# Patient Record
Sex: Male | Born: 1995 | Race: White | Hispanic: No | Marital: Single | Smoking: Former smoker
Health system: Southern US, Community
[De-identification: ages and names within clinical notes are randomized; demographics above are authoritative.]

## PROBLEM LIST (undated history)

## (undated) DIAGNOSIS — L509 Urticaria, unspecified: Secondary | ICD-10-CM

## (undated) DIAGNOSIS — T783XXA Angioneurotic edema, initial encounter: Secondary | ICD-10-CM

## (undated) DIAGNOSIS — R011 Cardiac murmur, unspecified: Secondary | ICD-10-CM

## (undated) DIAGNOSIS — F410 Panic disorder [episodic paroxysmal anxiety] without agoraphobia: Secondary | ICD-10-CM

## (undated) DIAGNOSIS — R569 Unspecified convulsions: Secondary | ICD-10-CM

## (undated) DIAGNOSIS — F419 Anxiety disorder, unspecified: Secondary | ICD-10-CM

## (undated) HISTORY — DX: Angioneurotic edema, initial encounter: T78.3XXA

## (undated) HISTORY — DX: Urticaria, unspecified: L50.9

---
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
## 2019-09-05 ENCOUNTER — Encounter: Payer: Self-pay | Admitting: Emergency Medicine

## 2019-09-05 ENCOUNTER — Emergency Department: Payer: Self-pay

## 2019-09-05 ENCOUNTER — Other Ambulatory Visit: Payer: Self-pay

## 2019-09-05 ENCOUNTER — Emergency Department
Admission: EM | Admit: 2019-09-05 | Discharge: 2019-09-05 | Disposition: A | Discharge status: Home / Self Care | Payer: Self-pay | Attending: Emergency Medicine | Admitting: Emergency Medicine

## 2019-09-05 DIAGNOSIS — R55 Syncope and collapse: Secondary | ICD-10-CM

## 2019-09-05 DIAGNOSIS — Z87891 Personal history of nicotine dependence: Secondary | ICD-10-CM | POA: Insufficient documentation

## 2019-09-05 DIAGNOSIS — R002 Palpitations: Secondary | ICD-10-CM | POA: Insufficient documentation

## 2019-09-05 DIAGNOSIS — R0602 Shortness of breath: Secondary | ICD-10-CM | POA: Insufficient documentation

## 2019-09-05 DIAGNOSIS — R42 Dizziness and giddiness: Secondary | ICD-10-CM | POA: Insufficient documentation

## 2019-09-05 HISTORY — DX: Cardiac murmur, unspecified: R01.1

## 2019-09-05 LAB — CBC
HCT: 45.4 % (ref 39.0–52.0)
Hemoglobin: 15.7 g/dL (ref 13.0–17.0)
MCH: 29.9 pg (ref 26.0–34.0)
MCHC: 34.6 g/dL (ref 30.0–36.0)
MCV: 86.5 fL (ref 80.0–100.0)
Platelets: 198 10*3/uL (ref 150–400)
RBC: 5.25 MIL/uL (ref 4.22–5.81)
RDW: 12.2 % (ref 11.5–15.5)
WBC: 6 10*3/uL (ref 4.0–10.5)
nRBC: 0 % (ref 0.0–0.2)

## 2019-09-05 LAB — BASIC METABOLIC PANEL
Anion gap: 9 (ref 5–15)
BUN: 11 mg/dL (ref 6–20)
CO2: 26 mmol/L (ref 22–32)
Calcium: 9.6 mg/dL (ref 8.9–10.3)
Chloride: 102 mmol/L (ref 98–111)
Creatinine, Ser: 0.79 mg/dL (ref 0.61–1.24)
GFR calc Af Amer: >60 mL/min (ref >60)
GFR calc non Af Amer: >60 mL/min (ref >60)
Glucose, Bld: 93 mg/dL (ref 70–99)
Potassium: 3.6 mmol/L (ref 3.5–5.1)
Sodium: 137 mmol/L (ref 135–145)

## 2019-09-05 LAB — URINALYSIS, COMPLETE (UACMP) WITH MICROSCOPIC
Bacteria, UA: NONE SEEN
Bilirubin Urine: NEGATIVE
Glucose, UA: NEGATIVE mg/dL
Hgb urine dipstick: NEGATIVE
Ketones, ur: NEGATIVE mg/dL
Leukocytes,Ua: NEGATIVE
Nitrite: NEGATIVE
Protein, ur: NEGATIVE mg/dL
Specific Gravity, Urine: 1.002 — ABNORMAL LOW (ref 1.005–1.030)
WBC, UA: NONE SEEN WBC/hpf (ref 0–5)
pH: 7 (ref 5.0–8.0)

## 2019-09-05 LAB — GLUCOSE, CAPILLARY: Glucose-Capillary: 93 mg/dL (ref 70–99)

## 2019-09-05 NOTE — Discharge Instructions (Signed)
Your workup here was re-assuring. Your EKG

## 2019-09-05 NOTE — ED Notes (Signed)

## 2019-09-05 NOTE — ED Provider Notes (Signed)
[facility] Emergency Department Provider Note  ____________________________________________   First MD Initiated Contact with Patient 09/05/19 1200     (approximate)  I have reviewed the triage vital signs and the nursing notes.   HISTORY  Chief Complaint Dizziness    HPI Edwin Maldonado is a 23 y.o. male with an episode of feeling dizzy and feeling hot with some shortness of breath.  Patient said he was at work when he he started feeling hot all over.  He has started to feel lightheaded like he was going to pass out but he did not pass out.  He does feel like his heart was racing.  No chest pain.  He did have some mild shortness of breath associated with it.  The symptoms went away.  He never lost consciousness.  He was worried that this could be related to his heart because he was told he had a murmur as a child.  He went to a urgent care sometime back and was told to follow with cardiology but he never did.  Patient says he did start using a different kind of marijuana recently.  He did smoke marijuana before this happened.  He denies any leg swelling, recent travel, recent surgeries. Denies IV drug use.   Dizziness onset this morning around 930, intermittent, better on its own, nothing made it come on.           Past Medical History:  Diagnosis Date  . Heart murmur     There are no active problems to display for this patient.   History reviewed. No pertinent surgical history.  Prior to Admission medications   Not on File    Allergies Patient has no known allergies.  No family history on file.  Social History Social History   Tobacco Use  . Smoking status: Former Smoker    Types: Cigarettes  . Smokeless tobacco: Never Used  Substance Use Topics  . Alcohol use: Yes    Comment: occasionally  . Drug use: Yes    Types: Marijuana      Review of Systems Constitutional: No fever/chills, lightheadedness Eyes: No visual changes.  ENT: No sore throat. Cardiovascular: Denies chest pain.  Positive feeling his heart was racing Respiratory: Positive shortness of breath Gastrointestinal: No abdominal pain.  No nausea, no vomiting.  No diarrhea.  No constipation. Genitourinary: Negative for dysuria. Musculoskeletal: Negative for back pain. Skin: Negative for rash. Neurological: Negative for headaches, focal weakness or numbness. All other ROS negative ____________________________________________   PHYSICAL EXAM:  VITAL SIGNS: ED Triage Vitals  Enc Vitals Group     BP 09/05/19 1036 133/84     Pulse Rate 09/05/19 1036 79     Resp 09/05/19 1036 16     Temp 09/05/19 1036 98.9 F (37.2 C)     Temp Source 09/05/19 1036 Oral     SpO2 09/05/19 1036 97 %     Weight 09/05/19 1038 185 lb (83.9 kg)     Height 09/05/19 1038 6\' 3"  (1.905 m)     Head Circumference --      Peak Flow --      Pain Score 09/05/19 1038 0     Pain Loc --      Pain Edu? --      Excl. in GC? --     Constitutional: Alert and oriented. Well appearing and in no acute distress. Eyes: Conjunctivae are normal. EOMI. Head: Atraumatic. Nose: No congestion/rhinnorhea. Mouth/Throat: Mucous membranes are moist.  Neck: No stridor. Trachea Midline. FROM Cardiovascular: Normal rate, regular rhythm. Grossly normal heart sounds.  Good peripheral circulation. Respiratory: Normal respiratory effort.  No retractions. Lungs CTAB. Gastrointestinal: Soft and nontender. No distention. No abdominal bruits.  Musculoskeletal: No lower extremity tenderness nor edema.  No joint effusions. Neurologic:  Normal speech and language. No gross focal neurologic deficits are appreciated.  Skin:  Skin is warm, dry and intact. No rash noted. Psychiatric: Mood and affect are normal. Speech and behavior are normal. GU: Deferred   ____________________________________________   LABS (all labs ordered are listed, but only abnormal results are displayed)  Labs Reviewed   URINALYSIS, COMPLETE (UACMP) WITH MICROSCOPIC - Abnormal; Notable for the following components:      Result Value   Color, Urine COLORLESS (*)    APPearance CLEAR (*)    Specific Gravity, Urine 1.002 (*)    All other components within normal limits  BASIC METABOLIC PANEL  CBC  GLUCOSE, CAPILLARY  CBG MONITORING, ED   ____________________________________________   ED ECG REPORT I, Vanessa , the attending physician, personally viewed and interpreted this ECG.  EKG is normal sinus rate of 86, no ST elevation, no T wave inversion, right bundle branch block, deep S waves. ____________________________________________    PROCEDURES  Procedure(s) performed (including Critical Care):  Procedures   ____________________________________________   INITIAL IMPRESSION / ASSESSMENT AND PLAN / ED COURSE  Edwin Maldonado was evaluated in Emergency Department on 09/05/2019 for the symptoms described in the history of present illness. He was evaluated in the context of the global COVID-19 pandemic, which necessitated consideration that the patient might be at risk for infection with the SARS-CoV-2 virus that causes COVID-19. Institutional protocols and algorithms that pertain to the evaluation of patients at risk for COVID-19 are in a state of rapid change based on information released by regulatory bodies including the CDC and federal and state organizations. These policies and algorithms were followed during the patient's care in the ED.    Patient presents with a near syncope episode.  Is most likely vasovagal in nature.  However given patient's history of murmur consider arrhythmia will get EKG, electrolytes, anemia, kidney dysfunction.  No evidence of heart failure on examination.  PERC negative so low suspicion for PE.  Although patient does have a history of murmur, I did not really appreciate one on examination. No evidence of endocarditis.   Labs are reassuring with normal kidney  function and normal electrolytes.  White count is normal therefore no evidence of infection.  Hemoglobin is normal therefore no evidence of anemia.  UA without evidence of urinary infection or dehydration.  Glucose was normal.  EKG without evidence of HOCM, WPW, Brugada.   Pt declined xray.   I discussed with patient that given his history of murmur and feeling palpitations that he should follow-up with cardiology to discuss needing a Holter monitor versus echocardiogram.  Patient expressed understanding.  Will follow up with cardiology outpatient.  Patient feels comfortable with discharge home.  I discussed the provisional nature of ED diagnosis, the treatment so far, the ongoing plan of care, follow up appointments and return precautions with the patient and any family or support people present. They expressed understanding and agreed with the plan, discharged home.     FINAL CLINICAL IMPRESSION(S) / ED DIAGNOSES   Final diagnoses:  Near syncope  Palpitations      MEDICATIONS GIVEN DURING THIS VISIT:  Medications - No data to display   ED Discharge Orders  None       Note:  This document was prepared using Dragon voice recognition software and may include unintentional dictation errors.   Concha Se, MD 09/05/19 1255

## 2019-09-05 NOTE — ED Triage Notes (Signed)
Patient presents to the ED with an episode of dizziness/feeling very hot and somewhat short of breath at work.  Patient states he is feeling much better but not feeling completely back to normal.  Patient reports a known heart murmur.  Patient is in no obvious distress at this time.

## 2020-01-09 DIAGNOSIS — K219 Gastro-esophageal reflux disease without esophagitis: Secondary | ICD-10-CM | POA: Insufficient documentation

## 2021-03-14 ENCOUNTER — Other Ambulatory Visit: Payer: Self-pay

## 2021-03-14 ENCOUNTER — Emergency Department (HOSPITAL_BASED_OUTPATIENT_CLINIC_OR_DEPARTMENT_OTHER)
Admission: EM | Admit: 2021-03-14 | Discharge: 2021-03-14 | Disposition: A | Discharge status: Home / Self Care | Payer: No Typology Code available for payment source | Attending: Emergency Medicine | Admitting: Emergency Medicine

## 2021-03-14 ENCOUNTER — Emergency Department (HOSPITAL_BASED_OUTPATIENT_CLINIC_OR_DEPARTMENT_OTHER): Payer: No Typology Code available for payment source | Admitting: Radiology

## 2021-03-14 ENCOUNTER — Encounter (HOSPITAL_BASED_OUTPATIENT_CLINIC_OR_DEPARTMENT_OTHER): Payer: Self-pay | Admitting: *Deleted

## 2021-03-14 DIAGNOSIS — M25552 Pain in left hip: Secondary | ICD-10-CM | POA: Diagnosis not present

## 2021-03-14 DIAGNOSIS — Z87891 Personal history of nicotine dependence: Secondary | ICD-10-CM | POA: Insufficient documentation

## 2021-03-14 DIAGNOSIS — M545 Low back pain, unspecified: Secondary | ICD-10-CM | POA: Insufficient documentation

## 2021-03-14 DIAGNOSIS — Y92410 Unspecified street and highway as the place of occurrence of the external cause: Secondary | ICD-10-CM | POA: Insufficient documentation

## 2021-03-14 MED ORDER — IBUPROFEN 800 MG PO TABS: 800.0000 mg | ORAL_TABLET | Freq: Three times a day (TID) | ORAL | 0 refills | Status: DC | PRN

## 2021-03-14 MED ORDER — IBUPROFEN 800 MG PO TABS
800.0000 mg | ORAL_TABLET | Freq: Once | ORAL | Status: AC
  Administered 2021-03-14: 800 mg via ORAL
  Filled 2021-03-14: qty 1

## 2021-03-14 MED ORDER — METHOCARBAMOL 500 MG PO TABS: 500.0000 mg | ORAL_TABLET | Freq: Four times a day (QID) | ORAL | 0 refills | Status: DC | PRN

## 2021-03-14 MED ORDER — METHOCARBAMOL 500 MG PO TABS
500.0000 mg | ORAL_TABLET | Freq: Once | ORAL | Status: AC
  Administered 2021-03-14: 500 mg via ORAL
  Filled 2021-03-14: qty 1

## 2021-03-14 NOTE — ED Notes (Signed)
Patient transported to XRAY at this Time. 

## 2021-03-14 NOTE — Discharge Instructions (Signed)
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
## 2021-10-13 ENCOUNTER — Emergency Department (HOSPITAL_BASED_OUTPATIENT_CLINIC_OR_DEPARTMENT_OTHER)
Admission: EM | Admit: 2021-10-13 | Discharge: 2021-10-14 | Disposition: A | Discharge status: Home / Self Care | Payer: Self-pay | Attending: Emergency Medicine | Admitting: Emergency Medicine

## 2021-10-13 ENCOUNTER — Other Ambulatory Visit: Payer: Self-pay

## 2021-10-13 ENCOUNTER — Encounter (HOSPITAL_BASED_OUTPATIENT_CLINIC_OR_DEPARTMENT_OTHER): Payer: Self-pay | Admitting: Obstetrics and Gynecology

## 2021-10-13 DIAGNOSIS — Z20822 Contact with and (suspected) exposure to covid-19: Secondary | ICD-10-CM | POA: Insufficient documentation

## 2021-10-13 DIAGNOSIS — Z87891 Personal history of nicotine dependence: Secondary | ICD-10-CM | POA: Insufficient documentation

## 2021-10-13 DIAGNOSIS — R21 Rash and other nonspecific skin eruption: Secondary | ICD-10-CM | POA: Insufficient documentation

## 2021-10-13 DIAGNOSIS — B349 Viral infection, unspecified: Secondary | ICD-10-CM | POA: Insufficient documentation

## 2021-10-13 LAB — RESP PANEL BY RT-PCR (FLU A&B, COVID) ARPGX2
Influenza A by PCR: NEGATIVE
Influenza B by PCR: NEGATIVE
SARS Coronavirus 2 by RT PCR: NEGATIVE

## 2021-10-13 NOTE — ED Triage Notes (Signed)
Patient reports to the ER for fever. Patient reports his fever keeps coming and going back up. Patient states he has been taking Tylenol Cold and Flu

## 2021-10-14 MED ORDER — IBUPROFEN 400 MG PO TABS
600.0000 mg | ORAL_TABLET | Freq: Once | ORAL | Status: AC
  Administered 2021-10-14: 600 mg via ORAL
  Filled 2021-10-14: qty 1

## 2021-10-14 NOTE — ED Notes (Signed)
Pt verbalizes understanding of discharge instructions. Opportunity for questioning and answers were provided. Armand removed by staff, pt discharged from ED to home. Educated on how to manage fever and take tylenol as needed.

## 2021-10-14 NOTE — ED Provider Notes (Signed)
[facility] EMERGENCY DEPT Provider Note   CSN: [id number] Arrival date & time: 10/13/21  2105     History Chief Complaint  Patient presents with   Fever    Edwin Maldonado is a 25 y.o. male.  HPI     This is a 25 year old male who presents with fever.  Patient reports 1 day history of fever up to 101.  He states he has taken Tylenol Cold and flu with some relief of symptoms.  He is also noted a rash over his face that is itchy.  He has not noticed a rash anywhere else.  No new exposures including foods, medications, detergents, lotions.  He has seen several people at work who have been sick.  He denies any sore throat, cough, shortness of breath, chest pain, nausea, vomiting.  He does report myalgias.  Past Medical History:  Diagnosis Date   Heart murmur     There are no problems to display for this patient.   History reviewed. No pertinent surgical history.     No family history on file.  Social History   Tobacco Use   Smoking status: Former    Types: Cigarettes   Smokeless tobacco: Never  Vaping Use   Vaping Use: Every day   Substances: Nicotine, Flavoring  Substance Use Topics   Alcohol use: Yes    Comment: occasionally   Drug use: Yes    Types: Marijuana    Home Medications Prior to Admission medications   Medication Sig Start Date End Date Taking? Authorizing Provider  ibuprofen (ADVIL) 800 MG tablet Take 1 tablet (800 mg total) by mouth every 8 (eight) hours as needed for moderate pain. 03/14/21   Long, Arlyss Repress, MD  methocarbamol (ROBAXIN) 500 MG tablet Take 1 tablet (500 mg total) by mouth every 6 (six) hours as needed for muscle spasms. 03/14/21   Long, Arlyss Repress, MD    Allergies    Patient has no known allergies.  Review of Systems   Review of Systems  Constitutional:  Positive for chills and fever.  Respiratory:  Negative for cough and shortness of breath.   Cardiovascular:  Negative for chest pain.  Gastrointestinal:   Negative for nausea and vomiting.  Skin:  Positive for rash.  Neurological:  Negative for headaches.  All other systems reviewed and are negative.  Physical Exam Updated Vital Signs BP (!) 145/75 (BP Location: Left Arm)   Pulse 100   Temp 99.8 F (37.7 C)   Resp 20   SpO2 97%   Physical Exam Vitals and nursing note reviewed.  Constitutional:      Appearance: He is well-developed. He is not ill-appearing.  HENT:     Head: Normocephalic and atraumatic.     Nose: Nose normal. No congestion.     Mouth/Throat:     Mouth: Mucous membranes are moist.     Pharynx: No oropharyngeal exudate or posterior oropharyngeal erythema.  Eyes:     Pupils: Pupils are equal, round, and reactive to light.  Cardiovascular:     Rate and Rhythm: Normal rate and regular rhythm.     Heart sounds: Normal heart sounds. No murmur heard. Pulmonary:     Effort: Pulmonary effort is normal. No respiratory distress.     Breath sounds: Normal breath sounds. No wheezing.  Abdominal:     Palpations: Abdomen is soft.     Tenderness: There is no abdominal tenderness. There is no rebound.  Musculoskeletal:     Cervical back: Normal  range of motion and neck supple.  Lymphadenopathy:     Cervical: No cervical adenopathy.  Skin:    General: Skin is warm and dry.     Comments: Blotchy urticarial type rash over the face and neck, blanching, confluent, no additional rash noted over the trunk, extremities, palms or soles  Neurological:     Mental Status: He is alert and oriented to person, place, and time.  Psychiatric:        Mood and Affect: Mood normal.    ED Results / Procedures / Treatments   Labs (all labs ordered are listed, but only abnormal results are displayed) Labs Reviewed  RESP PANEL BY RT-PCR (FLU A&B, COVID) [id number]    EKG None  Radiology No results found.  Procedures Procedures   Medications Ordered in ED Medications - No data to display  ED Course  I have reviewed the triage vital  signs and the nursing notes.  Pertinent labs & imaging results that were available during my care of the patient were reviewed by me and considered in my medical decision making (see chart for details).    MDM Rules/Calculators/A&P                           Patient presents with a fever.  He is nontoxic-appearing and vital signs are reassuring.  Aside from the fever he endorses a rash and myalgias.  No other significant symptoms.  COVID and influenza testing are negative.  His rash is interesting in that it just seems limited to the face.  It is urticarial in nature and confluent.  No signs or symptoms of anaphylaxis.  Question viral etiology.  Discussed with patient high suspicion for viral etiology.  Do not feel any warrants further work-up at this time.  Recommend Benadryl for itching and Tylenol or Motrin for fevers.  After history, exam, and medical workup I feel the patient has been appropriately medically screened and is safe for discharge home. Pertinent diagnoses were discussed with the patient. Patient was given return precautions.  Final Clinical Impression(s) / ED Diagnoses Final diagnoses:  Acute viral syndrome    Rx / DC Orders ED Discharge Orders     None        Meliyah Simon, Mayer Masker, MD 10/14/21 [phone]

## 2021-10-16 ENCOUNTER — Emergency Department (HOSPITAL_BASED_OUTPATIENT_CLINIC_OR_DEPARTMENT_OTHER): Payer: Self-pay

## 2021-10-16 ENCOUNTER — Emergency Department (HOSPITAL_BASED_OUTPATIENT_CLINIC_OR_DEPARTMENT_OTHER)
Admission: EM | Admit: 2021-10-16 | Discharge: 2021-10-16 | Disposition: A | Discharge status: Home / Self Care | Payer: Self-pay | Attending: Emergency Medicine | Admitting: Emergency Medicine

## 2021-10-16 ENCOUNTER — Encounter (HOSPITAL_BASED_OUTPATIENT_CLINIC_OR_DEPARTMENT_OTHER): Payer: Self-pay | Admitting: *Deleted

## 2021-10-16 ENCOUNTER — Other Ambulatory Visit: Payer: Self-pay

## 2021-10-16 DIAGNOSIS — Z87891 Personal history of nicotine dependence: Secondary | ICD-10-CM | POA: Insufficient documentation

## 2021-10-16 DIAGNOSIS — R42 Dizziness and giddiness: Secondary | ICD-10-CM | POA: Insufficient documentation

## 2021-10-16 DIAGNOSIS — R0789 Other chest pain: Secondary | ICD-10-CM | POA: Insufficient documentation

## 2021-10-16 DIAGNOSIS — R079 Chest pain, unspecified: Secondary | ICD-10-CM

## 2021-10-16 LAB — CBC WITH DIFFERENTIAL/PLATELET
Abs Immature Granulocytes: 0.01 10*3/uL (ref 0.00–0.07)
Basophils Absolute: 0 10*3/uL (ref 0.0–0.1)
Basophils Relative: 0 %
Eosinophils Absolute: 0 10*3/uL (ref 0.0–0.5)
Eosinophils Relative: 0 %
HCT: 49.3 % (ref 39.0–52.0)
Hemoglobin: 17.5 g/dL — ABNORMAL HIGH (ref 13.0–17.0)
Immature Granulocytes: 0 %
Lymphocytes Relative: 32 %
Lymphs Abs: 1.4 10*3/uL (ref 0.7–4.0)
MCH: 30.1 pg (ref 26.0–34.0)
MCHC: 35.5 g/dL (ref 30.0–36.0)
MCV: 84.9 fL (ref 80.0–100.0)
Monocytes Absolute: 0.8 10*3/uL (ref 0.1–1.0)
Monocytes Relative: 18 %
Neutro Abs: 2.2 10*3/uL (ref 1.7–7.7)
Neutrophils Relative %: 50 %
Platelets: 172 10*3/uL (ref 150–400)
RBC: 5.81 MIL/uL (ref 4.22–5.81)
RDW: 12.7 % (ref 11.5–15.5)
WBC: 4.3 10*3/uL (ref 4.0–10.5)
nRBC: 0 % (ref 0.0–0.2)

## 2021-10-16 LAB — BASIC METABOLIC PANEL
Anion gap: 9 (ref 5–15)
BUN: 12 mg/dL (ref 6–20)
CO2: 27 mmol/L (ref 22–32)
Calcium: 9.4 mg/dL (ref 8.9–10.3)
Chloride: 101 mmol/L (ref 98–111)
Creatinine, Ser: 0.71 mg/dL (ref 0.61–1.24)
GFR, Estimated: >60 mL/min (ref >60)
Glucose, Bld: 76 mg/dL (ref 70–99)
Potassium: 4.1 mmol/L (ref 3.5–5.1)
Sodium: 137 mmol/L (ref 135–145)

## 2021-10-16 LAB — TROPONIN I (HIGH SENSITIVITY): Troponin I (High Sensitivity): 5 ng/L (ref <18)

## 2021-10-16 LAB — D-DIMER, QUANTITATIVE: D-Dimer, Quant: <0.27 ug/mL-FEU (ref 0.00–0.50)

## 2021-10-16 NOTE — ED Provider Notes (Signed)
[facility] EMERGENCY DEPT Provider Note   CSN: [id number] Arrival date & time: 10/16/21  1114     History Chief Complaint  Patient presents with   Anxiety    Edwin Maldonado is a 25 y.o. male.  HPI  Patient presents with tight chest tightness.  The started acutely this morning, lasted for a few seconds.  Patient felt like his heart rate was beating quickly, did not actually check his heart rate at that time.  He felt lightheaded when he stood up, felt lightheaded in the shower.  It resolved without intervention, no falls.  Denies any dizziness or room spinning.  Patient reports the chest pain returned when he was driving prompting him to come to the ED.  Has not had pain like this before, was recently seen in the emergency department and diagnosed with a viral illness.  He was negative for COVID and flu, has been taking Tylenol which is helped with the symptoms.  Denies any cough, congestion, sore throat, shortness of breath.    Past Medical History:  Diagnosis Date   Heart murmur     There are no problems to display for this patient.   History reviewed. No pertinent surgical history.     No family history on file.  Social History   Tobacco Use   Smoking status: Former    Types: Cigarettes   Smokeless tobacco: Never  Vaping Use   Vaping Use: Every day   Substances: Nicotine, Flavoring  Substance Use Topics   Alcohol use: Yes    Comment: occasionally   Drug use: Yes    Types: Marijuana    Comment: last use 2 days ago 10/04/21    Home Medications Prior to Admission medications   Medication Sig Start Date End Date Taking? Authorizing Provider  ibuprofen (ADVIL) 800 MG tablet Take 1 tablet (800 mg total) by mouth every 8 (eight) hours as needed for moderate pain. 03/14/21   Long, Arlyss Repress, MD  methocarbamol (ROBAXIN) 500 MG tablet Take 1 tablet (500 mg total) by mouth every 6 (six) hours as needed for muscle spasms. 03/14/21   Long, Arlyss Repress,  MD    Allergies    Patient has no known allergies.  Review of Systems   Review of Systems  Constitutional:  Positive for fever.  HENT:  Negative for congestion.   Respiratory:  Positive for shortness of breath.   Cardiovascular:  Positive for chest pain and palpitations.  Gastrointestinal:  Negative for abdominal pain, nausea and vomiting.  Musculoskeletal:  Negative for back pain.  Neurological:  Positive for light-headedness. Negative for syncope.   Physical Exam Updated Vital Signs BP (!) 135/91 (BP Location: Right Arm)   Pulse (!) 59   Temp 97.7 F (36.5 C) (Oral)   Resp 18   Ht 6\' 3"  (1.905 m)   Wt 95.3 kg   SpO2 100%   BMI 26.25 kg/m   Physical Exam Vitals and nursing note reviewed. Exam conducted with a chaperone present.  Constitutional:      General: He is not in acute distress.    Appearance: Normal appearance.  HENT:     Head: Normocephalic and atraumatic.  Eyes:     General: No scleral icterus.       Right eye: No discharge.        Left eye: No discharge.     Extraocular Movements: Extraocular movements intact.     Pupils: Pupils are equal, round, and reactive to light.  Cardiovascular:     Rate and Rhythm: Normal rate and regular rhythm.     Pulses: Normal pulses.     Heart sounds: Normal heart sounds. No murmur heard.   No friction rub. No gallop.     Comments: Heart rate regular, no tachycardia. Pulmonary:     Effort: Pulmonary effort is normal. No respiratory distress.     Breath sounds: Normal breath sounds.     Comments: Lungs are clear to auscultation bilaterally Abdominal:     General: Abdomen is flat. Bowel sounds are normal. There is no distension.     Palpations: Abdomen is soft.     Tenderness: There is no abdominal tenderness.  Skin:    General: Skin is warm and dry.     Coloration: Skin is not jaundiced.  Neurological:     Mental Status: He is alert. Mental status is at baseline.     Coordination: Coordination normal.    ED  Results / Procedures / Treatments   Labs (all labs ordered are listed, but only abnormal results are displayed) Labs Reviewed - No data to display  EKG None  Radiology No results found.  Procedures Procedures   Medications Ordered in ED Medications - No data to display  ED Course  I have reviewed the triage vital signs and the nursing notes.  Pertinent labs & imaging results that were available during my care of the patient were reviewed by me and considered in my medical decision making (see chart for details).    MDM Rules/Calculators/A&P                           Patient presents with chest pain and lightheadedness.  This happened acutely, and is still happening intermittently.  Otherwise suspect this is likely secondary due to orthostatic changes, patient does have signs of heart strain on the EKG.  Discussed with my attending Dr. Durwin Nora who thinks we should check a troponin and dimer to rule out possible acute etiologies for heart strain.  We will proceed with labs and will get portable chest x-ray.  Vitals are stable, patient has been observed without any signs of arrhythmia on the cardiac monitor.  Dimer negative, doubt PE.  Troponin negative, no signs of heart strain.  Radiograph negative for pneumonia or any cardiopulmonary disease.  Cytosis, patient is not anemic.  Discharged in stable position, will have him follow-up with PCP.  No concern for emergent pathology based on his work-up at this time.  Final Clinical Impression(s) / ED Diagnoses Final diagnoses:  None    Rx / DC Orders ED Discharge Orders     None        Theron Arista, Cordelia Poche 10/16/21 1444    Gloris Manchester, MD 10/17/21 2139

## 2021-10-16 NOTE — Discharge Instructions (Addendum)
Your work-up was reassuring, no signs of cardiovascular pathology.  Continue staying hydrated and eating food.  Continue getting 8 hours of sleep.  Discontinue vaping and smoking cigarettes.  Can return back to work when you are feeling better, if you are still feeling rough tomorrow its okay to take a day off. Set up care with a primary care provider, information listed above. Return if things change or worsen.

## 2021-10-16 NOTE — ED Triage Notes (Signed)
Pt states he woke this morning and noticed his heart rate was fast, he can not tell me rate. He is anxious in triage. Pt shaky. Pt seen on 16th with a virus. States face felt a little numb but after slowing breathing it resolved, similar symptoms with left arm, non noted in triage.

## 2022-01-16 ENCOUNTER — Emergency Department (HOSPITAL_BASED_OUTPATIENT_CLINIC_OR_DEPARTMENT_OTHER)
Admission: EM | Admit: 2022-01-16 | Discharge: 2022-01-16 | Disposition: A | Discharge status: Home / Self Care | Payer: Self-pay | Attending: Emergency Medicine | Admitting: Emergency Medicine

## 2022-01-16 ENCOUNTER — Other Ambulatory Visit: Payer: Self-pay

## 2022-01-16 ENCOUNTER — Encounter (HOSPITAL_BASED_OUTPATIENT_CLINIC_OR_DEPARTMENT_OTHER): Payer: Self-pay

## 2022-01-16 ENCOUNTER — Emergency Department (HOSPITAL_BASED_OUTPATIENT_CLINIC_OR_DEPARTMENT_OTHER): Payer: Self-pay | Admitting: Radiology

## 2022-01-16 ENCOUNTER — Other Ambulatory Visit (HOSPITAL_BASED_OUTPATIENT_CLINIC_OR_DEPARTMENT_OTHER): Payer: Self-pay

## 2022-01-16 DIAGNOSIS — R002 Palpitations: Secondary | ICD-10-CM | POA: Insufficient documentation

## 2022-01-16 DIAGNOSIS — R079 Chest pain, unspecified: Secondary | ICD-10-CM | POA: Insufficient documentation

## 2022-01-16 DIAGNOSIS — Z20822 Contact with and (suspected) exposure to covid-19: Secondary | ICD-10-CM | POA: Insufficient documentation

## 2022-01-16 DIAGNOSIS — R42 Dizziness and giddiness: Secondary | ICD-10-CM | POA: Insufficient documentation

## 2022-01-16 HISTORY — DX: Unspecified convulsions: R56.9

## 2022-01-16 LAB — CBC
HCT: 46.6 % (ref 39.0–52.0)
Hemoglobin: 16.3 g/dL (ref 13.0–17.0)
MCH: 30.1 pg (ref 26.0–34.0)
MCHC: 35 g/dL (ref 30.0–36.0)
MCV: 86 fL (ref 80.0–100.0)
Platelets: 264 10*3/uL (ref 150–400)
RBC: 5.42 MIL/uL (ref 4.22–5.81)
RDW: 12.4 % (ref 11.5–15.5)
WBC: 6.9 10*3/uL (ref 4.0–10.5)
nRBC: 0 % (ref 0.0–0.2)

## 2022-01-16 LAB — BASIC METABOLIC PANEL
Anion gap: 11 (ref 5–15)
BUN: 10 mg/dL (ref 6–20)
CO2: 26 mmol/L (ref 22–32)
Calcium: 10.2 mg/dL (ref 8.9–10.3)
Chloride: 101 mmol/L (ref 98–111)
Creatinine, Ser: 0.86 mg/dL (ref 0.61–1.24)
GFR, Estimated: >60 mL/min (ref >60)
Glucose, Bld: 128 mg/dL — ABNORMAL HIGH (ref 70–99)
Potassium: 4.1 mmol/L (ref 3.5–5.1)
Sodium: 138 mmol/L (ref 135–145)

## 2022-01-16 LAB — RESP PANEL BY RT-PCR (FLU A&B, COVID) ARPGX2
Influenza A by PCR: NEGATIVE
Influenza B by PCR: NEGATIVE
SARS Coronavirus 2 by RT PCR: NEGATIVE

## 2022-01-16 LAB — TROPONIN I (HIGH SENSITIVITY): Troponin I (High Sensitivity): 3 ng/L (ref <18)

## 2022-01-16 MED ORDER — HYDROXYZINE HCL 25 MG PO TABS
25.0000 mg | ORAL_TABLET | Freq: Once | ORAL | Status: AC
  Administered 2022-01-16: 25 mg via ORAL
  Filled 2022-01-16: qty 1

## 2022-01-16 MED ORDER — HYDROXYZINE HCL 25 MG PO TABS
25.0000 mg | ORAL_TABLET | Freq: Four times a day (QID) | ORAL | 0 refills | Status: DC | PRN
  Filled 2022-01-16: qty 30, 8d supply, fill #0

## 2022-01-16 NOTE — ED Triage Notes (Signed)
Pt c/o generalized weakness, intermittent lightheaded periods over last 3 days. Pt reports it happens more when he has orthostatic changes.

## 2022-01-16 NOTE — Discharge Instructions (Signed)
Please take hydroxyzine as needed when you are feeling overwhelmed.  I will write you a prescription for the next 30 days.  Please contact [place] [facility] wellness to establish care and possible psychiatric referral to further evaluate for possible anxiety.  Please return to the emergency department for worsening symptoms.

## 2022-01-16 NOTE — ED Provider Notes (Signed)
[facility]   CSN: [id number] Arrival date & time: 01/16/22  1219     History  Chief Complaint  Patient presents with   Weakness    Edwin Maldonado is a 26 y.o. male who presents the emergency department with a several month history of lightheadedness, chest pain, and palpitations.  Patient states has been going on longer than a few months but has been a lot worse over the last several months.  His symptoms are primarily with changing of positions.  Did have an episode of chest pain today.  He states that he was formally evaluated by the cardiologist and had echocardiogram.  Old records reviewed which showed an echo performed on 02/01/2020 which showed a normal study with an ejection fraction of 50%.  He states has been generally weak over the last week or so.  Patient does mention that he has been significantly stressed over the last several months.  Does not have a formal diagnosis of anxiety but he states that he has been having the symptoms when he was feeling stressed.   Weakness     Home Medications Prior to Admission medications   Medication Sig Start Date End Date Taking? Authorizing Provider  hydrOXYzine (ATARAX) 25 MG tablet Take 1 tablet (25 mg total) by mouth every 6 (six) hours as needed. 01/16/22  Yes Meredeth IdeFleming, Harrie Cazarez M, PA-C  ibuprofen (ADVIL) 800 MG tablet Take 1 tablet (800 mg total) by mouth every 8 (eight) hours as needed for moderate pain. 03/14/21   Long, Arlyss RepressJoshua G, MD  methocarbamol (ROBAXIN) 500 MG tablet Take 1 tablet (500 mg total) by mouth every 6 (six) hours as needed for muscle spasms. 03/14/21   Long, Arlyss RepressJoshua G, MD      Allergies    Patient has no known allergies.    Review of Systems   Review of Systems  Neurological:  Positive for weakness.  All other systems reviewed and are negative.  Physical Exam Updated Vital Signs BP (!) 142/92    Pulse 64    Temp 98.9 F (37.2 C)    Resp 12    Ht 6\' 3"  (1.905 m)    Wt  97.5 kg    SpO2 100%    BMI 26.87 kg/m  Physical Exam Vitals and nursing note reviewed.  Constitutional:      General: He is not in acute distress.    Appearance: Normal appearance.  HENT:     Head: Normocephalic and atraumatic.  Eyes:     General:        Right eye: No discharge.        Left eye: No discharge.  Cardiovascular:     Comments: Regular rate and rhythm.  S1/S2 are distinct without any evidence of murmur, rubs, or gallops.  Radial pulses are 2+ bilaterally.  Dorsalis pedis pulses are 2+ bilaterally.  No evidence of pedal edema. Pulmonary:     Comments: Clear to auscultation bilaterally.  Normal effort.  No respiratory distress.  No evidence of wheezes, rales, or rhonchi heard throughout. Abdominal:     General: Abdomen is flat. Bowel sounds are normal. There is no distension.     Tenderness: There is no abdominal tenderness. There is no guarding or rebound.  Musculoskeletal:        General: Normal range of motion.     Cervical back: Neck supple.  Skin:    General: Skin is warm and dry.     Findings: No rash.  Neurological:     General: No focal deficit present.     Mental Status: He is alert.  Psychiatric:        Mood and Affect: Mood normal.        Behavior: Behavior normal.    ED Results / Procedures / Treatments   Labs (all labs ordered are listed, but only abnormal results are displayed) Labs Reviewed  BASIC METABOLIC PANEL - Abnormal; Notable for the following components:      Result Value   Glucose, Bld 128 (*)    All other components within normal limits  RESP PANEL BY RT-PCR (FLU A&B, COVID) ARPGX2  CBC  TROPONIN I (HIGH SENSITIVITY)    EKG EKG Interpretation  Date/Time:  Thursday January 16 2022 12:25:39 EST Ventricular Rate:  86 PR Interval:  178 QRS Duration: 100 QT Interval:  356 QTC Calculation: 426 R Axis:   90 Text Interpretation: Normal sinus rhythm Rightward axis Borderline ECG When compared with ECG of 16-Oct-2021 11:41, No  significant change was found Confirmed by Meridee Score [phone]) on 01/16/2022 12:27:44 PM  Radiology DG Chest Port 1 View  Result Date: 01/16/2022 CLINICAL DATA:  Chest pain. EXAM: PORTABLE CHEST 1 VIEW COMPARISON:  10/16/2021 FINDINGS: The lungs are clear without focal pneumonia, edema, pneumothorax or pleural effusion. The cardiopericardial silhouette is within normal limits for size. The visualized bony structures of the thorax show no acute abnormality. Telemetry leads overlie the chest. IMPRESSION: No active disease. Electronically Signed   By: Kennith Center M.D.   On: 01/16/2022 13:17    Procedures Procedures  Cardiac monitoring revealed normal sinus rhythm.  Did have some slight hypertension.  No hypertension diagnosis in the past nor does he have family history.  We will likely have to get this evaluated when he follows up with [facility] health [facility] wellness.  Normal respirations and oxygenating good on room air.  Medications Ordered in ED Medications  hydrOXYzine (ATARAX) tablet 25 mg (25 mg Oral Given 01/16/22 1259)    ED Course/ Medical Decision Making/ A&P                           Medical Decision Making Amount and/or Complexity of Data Reviewed Labs: ordered. Radiology: ordered.  Risk Prescription drug management.   Edwin Maldonado is a 26 y.o. male with no significant medical history to impact his care who presents the emergency department with a several month history of lightheadedness, chest pain, and palpitations.  Old records were reviewed which showed normal echocardiogram done in 2021. This could be vasovagal in nature considering he's been having the symptoms with changing of position.  We will plan to get orthostatic vital signs.  Given the time course I did consider however do believe ACS is less likely.  Does not appear to be stable angina given history.  This could be psychiatric in origin.  Patient is feeling anxious right now.  We will give him  hydroxyzine and reassess.  I will plan to get chest pain labs, EKG, and chest x-ray.  We will reevaluate after we get some of the lab work and imaging back and after he is gotten Atarax.  He is currently resting comfortably in the emergency department with normal vital signs.  He is in no acute distress at this time.  I personally reviewed imaging and labs.  From a labs perspective, CBC, respiratory panel, BMP, and troponin were all negative.  Again I do not  think this is ACS, heart failure, or infectious etiology.  Chest x-ray did not show any signs of pneumonia, pneumothorax, or pleural effusions.  Otherwise normal chest x-ray.  I agree with the radiologist or potation.  Patient was orthostatic negative.  At this time it is unclear exactly what is causing his symptoms.  Still I am concerned this is likely psychiatric in nature.  On reevaluation, patient is feeling much better than when he arrived.  He states that he is feels like he is back to normal.  He was much improved after Atarax.  Again making this more likely to be psychiatric in nature.  I will likely write him a prescription for Atarax.  He does not have a primary care doctor.  I will provide him with [place] [facility] for follow-up and will likely need possible psychiatric evaluation to diagnose possible anxiety versus depression.  Patient was amendable to this plan.  Given the clinical scenario, I do believe he is safe for outpatient follow-up.  He does not have any significant social determinants of health to impact his care or discharge today.  Vitals are stable he is safe for discharge.  Final Clinical Impression(s) / ED Diagnoses Final diagnoses:  Chest pain, unspecified type    Rx / DC Orders ED Discharge Orders          Ordered    hydrOXYzine (ATARAX) 25 MG tablet  Every 6 hours PRN        01/16/22 1434              Honor Loh [place], [place] 01/16/22 1435    Terrilee Files,  MD 01/16/22 2054
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
## 2022-06-20 ENCOUNTER — Ambulatory Visit
Admission: EM | Admit: 2022-06-20 | Discharge: 2022-06-20 | Disposition: A | Discharge status: Home / Self Care | Payer: BC Managed Care – PPO | Attending: Emergency Medicine | Admitting: Emergency Medicine

## 2022-06-20 ENCOUNTER — Encounter: Payer: Self-pay | Admitting: Emergency Medicine

## 2022-06-20 DIAGNOSIS — R079 Chest pain, unspecified: Secondary | ICD-10-CM | POA: Diagnosis not present

## 2022-06-20 DIAGNOSIS — R0789 Other chest pain: Secondary | ICD-10-CM | POA: Diagnosis not present

## 2022-06-20 NOTE — ED Triage Notes (Signed)
Pt presents with left side chest pain that started today at work. The pain started after picking up an item.

## 2023-02-18 ENCOUNTER — Other Ambulatory Visit: Payer: Self-pay

## 2023-02-18 ENCOUNTER — Emergency Department (HOSPITAL_COMMUNITY): Payer: BC Managed Care – PPO

## 2023-02-18 ENCOUNTER — Emergency Department (HOSPITAL_COMMUNITY)
Admission: EM | Admit: 2023-02-18 | Discharge: 2023-02-18 | Disposition: A | Discharge status: Home / Self Care | Payer: BC Managed Care – PPO | Attending: Emergency Medicine | Admitting: Emergency Medicine

## 2023-02-18 ENCOUNTER — Encounter (HOSPITAL_COMMUNITY): Payer: Self-pay

## 2023-02-18 DIAGNOSIS — R002 Palpitations: Secondary | ICD-10-CM | POA: Insufficient documentation

## 2023-02-18 DIAGNOSIS — R000 Tachycardia, unspecified: Secondary | ICD-10-CM | POA: Diagnosis not present

## 2023-02-18 LAB — CBC WITH DIFFERENTIAL/PLATELET
Abs Immature Granulocytes: 0.01 10*3/uL (ref 0.00–0.07)
Basophils Absolute: 0 10*3/uL (ref 0.0–0.1)
Basophils Relative: 0 %
Eosinophils Absolute: 0 10*3/uL (ref 0.0–0.5)
Eosinophils Relative: 1 %
HCT: 40.5 % (ref 39.0–52.0)
Hemoglobin: 14 g/dL (ref 13.0–17.0)
Immature Granulocytes: 0 %
Lymphocytes Relative: 37 %
Lymphs Abs: 1.9 10*3/uL (ref 0.7–4.0)
MCH: 30.4 pg (ref 26.0–34.0)
MCHC: 34.6 g/dL (ref 30.0–36.0)
MCV: 87.9 fL (ref 80.0–100.0)
Monocytes Absolute: 0.4 10*3/uL (ref 0.1–1.0)
Monocytes Relative: 8 %
Neutro Abs: 2.8 10*3/uL (ref 1.7–7.7)
Neutrophils Relative %: 54 %
Platelets: 186 10*3/uL (ref 150–400)
RBC: 4.61 MIL/uL (ref 4.22–5.81)
RDW: 12.4 % (ref 11.5–15.5)
WBC: 5.2 10*3/uL (ref 4.0–10.5)
nRBC: 0 % (ref 0.0–0.2)

## 2023-02-18 LAB — RAPID URINE DRUG SCREEN, HOSP PERFORMED
Amphetamines: NOT DETECTED
Barbiturates: NOT DETECTED
Benzodiazepines: NOT DETECTED
Cocaine: NOT DETECTED
Opiates: NOT DETECTED
Tetrahydrocannabinol: POSITIVE — AB

## 2023-02-18 LAB — COMPREHENSIVE METABOLIC PANEL
ALT: 14 U/L (ref 0–44)
AST: 24 U/L (ref 15–41)
Albumin: 4.1 g/dL (ref 3.5–5.0)
Alkaline Phosphatase: 51 U/L (ref 38–126)
Anion gap: 11 (ref 5–15)
BUN: 15 mg/dL (ref 6–20)
CO2: 24 mmol/L (ref 22–32)
Calcium: 8.9 mg/dL (ref 8.9–10.3)
Chloride: 101 mmol/L (ref 98–111)
Creatinine, Ser: 0.96 mg/dL (ref 0.61–1.24)
GFR, Estimated: >60 mL/min (ref >60)
Glucose, Bld: 152 mg/dL — ABNORMAL HIGH (ref 70–99)
Potassium: 3.3 mmol/L — ABNORMAL LOW (ref 3.5–5.1)
Sodium: 136 mmol/L (ref 135–145)
Total Bilirubin: 0.7 mg/dL (ref 0.3–1.2)
Total Protein: 6.4 g/dL — ABNORMAL LOW (ref 6.5–8.1)

## 2023-02-18 LAB — TROPONIN I (HIGH SENSITIVITY)
Troponin I (High Sensitivity): 5 ng/L (ref <18)
Troponin I (High Sensitivity): 17 ng/L (ref <18)

## 2023-02-18 MED ORDER — SODIUM CHLORIDE 0.9 % IV BOLUS
1000.0000 mL | Freq: Once | INTRAVENOUS | Status: AC
  Administered 2023-02-18: 1000 mL via INTRAVENOUS

## 2023-02-18 MED ORDER — POTASSIUM CHLORIDE CRYS ER 20 MEQ PO TBCR
40.0000 meq | EXTENDED_RELEASE_TABLET | Freq: Once | ORAL | Status: AC
  Administered 2023-02-18: 40 meq via ORAL
  Filled 2023-02-18: qty 2

## 2023-02-18 NOTE — ED Provider Notes (Signed)
[facility] Provider Note   CSN: [id number] Arrival date & time: 02/18/23  1607     History  Chief Complaint  Patient presents with   Tachycardia    Edwin Maldonado is a 27 y.o. male.  27 year old male with prior medical history as detailed below presents for evaluation.  Patient reports minimal sleep last night secondary to work demands.  Patient reports that after finishing work today he took a warm shower.  As he was leaving the shower he felt palpitations.  EMS reports that initial heart rate was in the 160s.  EMS reports that with 500 mL of fluids his heart rate improved.  EMS reports sinus tachycardia in the field.  Patient feels somewhat improved on arrival.  Patient without reported associated chest pain or shortness of breath.  The history is provided by the patient and medical records.       Home Medications Prior to Admission medications   Medication Sig Start Date End Date Taking? Authorizing Provider  hydrOXYzine (ATARAX) 25 MG tablet Take 1 tablet (25 mg total) by mouth every 6 (six) hours as needed. 01/16/22   Myna Bright M, PA-C  ibuprofen (ADVIL) 800 MG tablet Take 1 tablet (800 mg total) by mouth every 8 (eight) hours as needed for moderate pain. 03/14/21   Long, Wonda Olds, MD  methocarbamol (ROBAXIN) 500 MG tablet Take 1 tablet (500 mg total) by mouth every 6 (six) hours as needed for muscle spasms. 03/14/21   Long, Wonda Olds, MD      Allergies    Other    Review of Systems   Review of Systems  All other systems reviewed and are negative.   Physical Exam Updated Vital Signs BP 115/67   Pulse 95   Temp (!) 97.5 F (36.4 C) (Oral)   Resp (!) 27   Ht 6' 3"$  (1.905 m)   Wt 83.9 kg   SpO2 100%   BMI 23.12 kg/m  Physical Exam Vitals and nursing note reviewed.  Constitutional:      General: He is not in acute distress.    Appearance: Normal appearance. He is well-developed.  HENT:     Head:  Normocephalic and atraumatic.  Eyes:     Conjunctiva/sclera: Conjunctivae normal.     Pupils: Pupils are equal, round, and reactive to light.  Cardiovascular:     Rate and Rhythm: Regular rhythm. Tachycardia present.     Heart sounds: Normal heart sounds.  Pulmonary:     Effort: Pulmonary effort is normal. No respiratory distress.     Breath sounds: Normal breath sounds.  Abdominal:     General: There is no distension.     Palpations: Abdomen is soft.     Tenderness: There is no abdominal tenderness.  Musculoskeletal:        General: No deformity. Normal range of motion.     Cervical back: Normal range of motion and neck supple.  Skin:    General: Skin is warm and dry.  Neurological:     General: No focal deficit present.     Mental Status: He is alert and oriented to person, place, and time.     ED Results / Procedures / Treatments   Labs (all labs ordered are listed, but only abnormal results are displayed) Labs Reviewed  COMPREHENSIVE METABOLIC PANEL - Abnormal; Notable for the following components:      Result Value   Potassium 3.3 (*)    Glucose, Bld  152 (*)    Total Protein 6.4 (*)    All other components within normal limits  RAPID URINE DRUG SCREEN, HOSP PERFORMED - Abnormal; Notable for the following components:   Tetrahydrocannabinol POSITIVE (*)    All other components within normal limits  CBC WITH DIFFERENTIAL/PLATELET  TROPONIN I (HIGH SENSITIVITY)  TROPONIN I (HIGH SENSITIVITY)    EKG EKG Interpretation  Date/Time:  Wednesday February 18 2023 16:19:57 EST Ventricular Rate:  106 PR Interval:  178 QRS Duration: 109 QT Interval:  374 QTC Calculation: 497 R Axis:   97 Text Interpretation: Sinus tachycardia Borderline right axis deviation Anterolateral Q wave, probably normal for age Prolonged QT interval Confirmed by Dene Gentry [phone]) on 02/18/2023 4:36:36 PM  Radiology DG Chest Port 1 View  Result Date: 02/18/2023 CLINICAL DATA:  Tachycardia  EXAM: PORTABLE CHEST 1 VIEW COMPARISON:  01/16/2022 FINDINGS: Frontal view of the chest demonstrates an unremarkable cardiac silhouette. No airspace disease, effusion, or pneumothorax. No acute bony abnormality. IMPRESSION: 1. No acute intrathoracic process. Electronically Signed   By: Randa Ngo M.D.   On: 02/18/2023 17:30    Procedures Procedures    Medications Ordered in ED Medications  sodium chloride 0.9 % bolus 1,000 mL (0 mLs Intravenous Stopped 02/18/23 1725)  potassium chloride SA (KLOR-CON M) CR tablet 40 mEq (40 mEq Oral Given 02/18/23 1758)    ED Course/ Medical Decision Making/ A&P                             Medical Decision Making Amount and/or Complexity of Data Reviewed Labs: ordered. Radiology: ordered.  Risk Prescription drug management.    Medical Screen Complete  This patient presented to the ED with complaint of palpitations, tachycardia.  This complaint involves an extensive number of treatment options. The initial differential diagnosis includes, but is not limited to, tachycardia, metabolic abnormality, arrhythmia, etc.  This presentation is: Acute, Self-Limited, Previously Undiagnosed, Uncertain Prognosis, Complicated, Systemic Symptoms, and Threat to Life/Bodily Function  Patient without documented prior cardiac history presents with complaint of palpitations.  EMS reported heart rate into the 160s.  EMS suggest that initial rhythm was sinus tachycardia.  Patient notably improved with minimal IV fluids during transport.  Screening labs obtained are without significant abnormality.  At time of discharge patient is reassured and comfortable.  Heart rate is improved significantly.  Patient's symptoms most likely secondary to poor sleep and poor hydration.  However, patient requested referral to cardiology for further evaluation of possible arrhythmia.  Importance of close follow-up is stressed.  Strict return precautions given and  understood.    Additional history obtained:  Additional history obtained from EMS External records from outside sources obtained and reviewed including prior ED visits and prior Inpatient records.    Lab Tests:  I ordered and personally interpreted labs.  The pertinent results include: CBC, CMP, urine tox, troponin x 2   Imaging Studies ordered:  I ordered imaging studies including chest x-ray I independently visualized and interpreted obtained imaging which showed NAD I agree with the radiologist interpretation.   Cardiac Monitoring:  The patient was maintained on a cardiac monitor.  I personally viewed and interpreted the cardiac monitor which showed an underlying rhythm of: Sinus tach, NSR   Medicines ordered:  I ordered medication including IV fluids, potassium for suspected dehydration Reevaluation of the patient after these medicines showed that the patient: improved   Problem List / ED Course:  Palpitations  Reevaluation:  After the interventions noted above, I reevaluated the patient and found that they have: improved  Disposition:  After consideration of the diagnostic results and the patients response to treatment, I feel that the patent would benefit from close outpatient follow-up.          Final Clinical Impression(s) / ED Diagnoses Final diagnoses:  Palpitations    Rx / DC Orders ED Discharge Orders     None         Valarie Merino, MD 02/18/23 2243

## 2023-02-18 NOTE — ED Triage Notes (Signed)
Patient brought in by EMS due to tachycardia. Pt got home from work today and took a shower and felt like his heart was racing. Upon EMS arrival, patient's HR was 160's. Pt reports has had similar episodes in the past, but none that have lasted this long and none that has been as elevated as 160's. Pt denies any stimulant use. Pt received 588m NS in route via EMS. Denies chest pain, reports some shortness of breath.

## 2023-02-18 NOTE — Discharge Instructions (Signed)
Return for any problem.  ?

## 2023-03-09 ENCOUNTER — Emergency Department (HOSPITAL_BASED_OUTPATIENT_CLINIC_OR_DEPARTMENT_OTHER)
Admission: EM | Admit: 2023-03-09 | Discharge: 2023-03-09 | Disposition: A | Discharge status: Home / Self Care | Payer: BC Managed Care – PPO | Attending: Emergency Medicine | Admitting: Emergency Medicine

## 2023-03-09 ENCOUNTER — Encounter (HOSPITAL_BASED_OUTPATIENT_CLINIC_OR_DEPARTMENT_OTHER): Payer: Self-pay | Admitting: *Deleted

## 2023-03-09 ENCOUNTER — Emergency Department (HOSPITAL_BASED_OUTPATIENT_CLINIC_OR_DEPARTMENT_OTHER): Payer: BC Managed Care – PPO | Admitting: Radiology

## 2023-03-09 ENCOUNTER — Other Ambulatory Visit: Payer: Self-pay

## 2023-03-09 DIAGNOSIS — R110 Nausea: Secondary | ICD-10-CM | POA: Insufficient documentation

## 2023-03-09 DIAGNOSIS — R0789 Other chest pain: Secondary | ICD-10-CM | POA: Diagnosis present

## 2023-03-09 LAB — BASIC METABOLIC PANEL
Anion gap: 6 (ref 5–15)
BUN: 14 mg/dL (ref 6–20)
CO2: 28 mmol/L (ref 22–32)
Calcium: 9.8 mg/dL (ref 8.9–10.3)
Chloride: 104 mmol/L (ref 98–111)
Creatinine, Ser: 0.8 mg/dL (ref 0.61–1.24)
GFR, Estimated: >60 mL/min (ref >60)
Glucose, Bld: 93 mg/dL (ref 70–99)
Potassium: 3.9 mmol/L (ref 3.5–5.1)
Sodium: 138 mmol/L (ref 135–145)

## 2023-03-09 LAB — CBC WITH DIFFERENTIAL/PLATELET
Abs Immature Granulocytes: 0.01 10*3/uL (ref 0.00–0.07)
Basophils Absolute: 0 10*3/uL (ref 0.0–0.1)
Basophils Relative: 0 %
Eosinophils Absolute: 0.1 10*3/uL (ref 0.0–0.5)
Eosinophils Relative: 1 %
HCT: 42.7 % (ref 39.0–52.0)
Hemoglobin: 15.3 g/dL (ref 13.0–17.0)
Immature Granulocytes: 0 %
Lymphocytes Relative: 42 %
Lymphs Abs: 2.8 10*3/uL (ref 0.7–4.0)
MCH: 30.7 pg (ref 26.0–34.0)
MCHC: 35.8 g/dL (ref 30.0–36.0)
MCV: 85.6 fL (ref 80.0–100.0)
Monocytes Absolute: 0.7 10*3/uL (ref 0.1–1.0)
Monocytes Relative: 11 %
Neutro Abs: 3.1 10*3/uL (ref 1.7–7.7)
Neutrophils Relative %: 46 %
Platelets: 211 10*3/uL (ref 150–400)
RBC: 4.99 MIL/uL (ref 4.22–5.81)
RDW: 12.4 % (ref 11.5–15.5)
WBC: 6.7 10*3/uL (ref 4.0–10.5)
nRBC: 0 % (ref 0.0–0.2)

## 2023-03-09 LAB — TROPONIN I (HIGH SENSITIVITY): Troponin I (High Sensitivity): 5 ng/L (ref <18)

## 2023-03-09 MED ORDER — ONDANSETRON 4 MG PO TBDP
4.0000 mg | ORAL_TABLET | Freq: Once | ORAL | Status: AC
  Administered 2023-03-09: 4 mg via ORAL
  Filled 2023-03-09: qty 1

## 2023-03-09 NOTE — ED Notes (Signed)
Patient transported to X-ray 

## 2023-03-09 NOTE — ED Provider Notes (Signed)
[name]  Provider Note  CSN: [id number] Arrival date & time: 03/09/23 0510  History Chief Complaint  Patient presents with   Chest Pain    Edwin Maldonado is a 27 y.o. male with no known medical problems reports he woke up earlier tonight with chest pain, like heart burn, radiating into L shoulder with chills in his feet. He is concerned he has had a heart attack. Took some Tums with minimal improvement. Some nausea, no vomiting. He has had several prior ED evaluations for chest which have been negative. Seen in [place] 3 weeks ago for tachycardia (reported by EMS to be a sinus tachycardia rate 160s) but otherwise his workup that day was negative. He has been referred to Cardiology but has not yet made an appointment.    Home Medications Prior to Admission medications   Not on File     Allergies    Other   Review of Systems   Review of Systems Please see HPI for pertinent positives and negatives  Physical Exam BP (!) 148/93 (BP Location: Right Arm)   Pulse 65   Temp 98.6 F (37 C) (Oral)   Resp 14   Ht '6\' 1"'$  (1.854 m)   Wt 83.9 kg   SpO2 100%   BMI 24.41 kg/m   Physical Exam Vitals and nursing note reviewed.  Constitutional:      Appearance: Normal appearance.  HENT:     Head: Normocephalic and atraumatic.     Nose: Nose normal.     Mouth/Throat:     Mouth: Mucous membranes are moist.  Eyes:     Extraocular Movements: Extraocular movements intact.     Conjunctiva/sclera: Conjunctivae normal.  Cardiovascular:     Rate and Rhythm: Normal rate.  Pulmonary:     Effort: Pulmonary effort is normal.     Breath sounds: Normal breath sounds.  Abdominal:     General: Abdomen is flat.     Palpations: Abdomen is soft.     Tenderness: There is no abdominal tenderness.  Musculoskeletal:        General: No swelling. Normal range of motion.     Cervical back: Neck supple.  Skin:    General: Skin is warm and dry.   Neurological:     General: No focal deficit present.     Mental Status: He is alert.  Psychiatric:        Mood and Affect: Mood normal.     ED Results / Procedures / Treatments   EKG EKG Interpretation  Date/Time:  Monday March 09 2023 05:18:43 EDT Ventricular Rate:  66 PR Interval:  191 QRS Duration: 109 QT Interval:  414 QTC Calculation: 434 R Axis:   91 Text Interpretation: Sinus rhythm Borderline right axis deviation RSR' in V1 or V2, probably normal variant Since last tracing Rate slower Confirmed by Calvert Cantor [phone]) on 03/09/2023 5:25:44 AM  Procedures Procedures  Medications Ordered in the ED Medications  ondansetron (ZOFRAN-ODT) disintegrating tablet 4 mg (4 mg Oral Given 03/09/23 0543)    Initial Impression and Plan  Patient with low risk of ACS, here with atypical chest pain, more likely to be GERD or anxiety. Will check labs to reassure patient no acute process and anticipate continued outpatient management if negative.   ED Course   Clinical Course as of 03/09/23 0629  Mon Mar 09, 2023  0548 CBC is normal.  [CS]  0603 I personally viewed the images from radiology studies  and agree with radiologist interpretation: CXR is clear [CS]  [id number] BMP is normal.  [CS]  [id number] Trop is neg. Given duration of symptoms, low risk factor profile and atypical presentation, will not do a delta trop. Patient reassured negative workup today. Continue with outpatient management.  [CS]    Clinical Course User Index [CS] Truddie Hidden, MD     MDM Rules/Calculators/A&P Medical Decision Making Problems Addressed: Atypical chest pain: acute illness or injury  Amount and/or Complexity of Data Reviewed Labs: ordered. Decision-making details documented in ED Course. Radiology: ordered and independent interpretation performed. Decision-making details documented in ED Course. ECG/medicine tests: ordered and independent interpretation performed. Decision-making details  documented in ED Course.  Risk Prescription drug management.     Final Clinical Impression(s) / ED Diagnoses Final diagnoses:  Atypical chest pain    Rx / DC Orders ED Discharge Orders     None        Truddie Hidden, MD 03/09/23 [phone]

## 2023-03-09 NOTE — ED Notes (Signed)
Reviewed AVS with patient, patient expressed understanding of directions, denies further questions at this time. 

## 2023-03-09 NOTE — ED Triage Notes (Addendum)
Pt c/o of waking up approx 1.5 hours ago with "heartburn"  also c/o of pain to his left shoulder and anterior chest. Describes as a burning. Took a tums. C/o of nausea. Denies vomiting.

## 2023-09-24 ENCOUNTER — Encounter (HOSPITAL_COMMUNITY): Payer: Self-pay

## 2023-09-24 ENCOUNTER — Emergency Department (HOSPITAL_COMMUNITY): Payer: BC Managed Care – PPO

## 2023-09-24 ENCOUNTER — Emergency Department (HOSPITAL_COMMUNITY)
Admission: EM | Admit: 2023-09-24 | Discharge: 2023-09-24 | Disposition: A | Discharge status: Home / Self Care | Payer: BC Managed Care – PPO | Attending: Emergency Medicine | Admitting: Emergency Medicine

## 2023-09-24 DIAGNOSIS — E876 Hypokalemia: Secondary | ICD-10-CM | POA: Diagnosis not present

## 2023-09-24 DIAGNOSIS — R079 Chest pain, unspecified: Secondary | ICD-10-CM | POA: Diagnosis not present

## 2023-09-24 DIAGNOSIS — R002 Palpitations: Secondary | ICD-10-CM | POA: Diagnosis present

## 2023-09-24 DIAGNOSIS — R202 Paresthesia of skin: Secondary | ICD-10-CM | POA: Diagnosis not present

## 2023-09-24 DIAGNOSIS — R519 Headache, unspecified: Secondary | ICD-10-CM | POA: Insufficient documentation

## 2023-09-24 DIAGNOSIS — F419 Anxiety disorder, unspecified: Secondary | ICD-10-CM | POA: Insufficient documentation

## 2023-09-24 HISTORY — DX: Panic disorder (episodic paroxysmal anxiety): F41.0

## 2023-09-24 HISTORY — DX: Anxiety disorder, unspecified: F41.9

## 2023-09-24 LAB — CBC
HCT: 44.1 % (ref 39.0–52.0)
Hemoglobin: 15.4 g/dL (ref 13.0–17.0)
MCH: 29.8 pg (ref 26.0–34.0)
MCHC: 34.9 g/dL (ref 30.0–36.0)
MCV: 85.3 fL (ref 80.0–100.0)
Platelets: 208 10*3/uL (ref 150–400)
RBC: 5.17 MIL/uL (ref 4.22–5.81)
RDW: 11.9 % (ref 11.5–15.5)
WBC: 8.3 10*3/uL (ref 4.0–10.5)
nRBC: 0 % (ref 0.0–0.2)

## 2023-09-24 LAB — BASIC METABOLIC PANEL
Anion gap: 10 (ref 5–15)
BUN: 18 mg/dL (ref 6–20)
CO2: 25 mmol/L (ref 22–32)
Calcium: 9.8 mg/dL (ref 8.9–10.3)
Chloride: 102 mmol/L (ref 98–111)
Creatinine, Ser: 0.76 mg/dL (ref 0.61–1.24)
GFR, Estimated: >60 mL/min (ref >60)
Glucose, Bld: 112 mg/dL — ABNORMAL HIGH (ref 70–99)
Potassium: 3.3 mmol/L — ABNORMAL LOW (ref 3.5–5.1)
Sodium: 137 mmol/L (ref 135–145)

## 2023-09-24 LAB — TROPONIN I (HIGH SENSITIVITY)
Troponin I (High Sensitivity): 4 ng/L (ref <18)
Troponin I (High Sensitivity): 4 ng/L (ref <18)

## 2023-09-24 NOTE — Discharge Instructions (Signed)
You were evaluated today for chest pain with a reassuring workup.  This may be related to underlying anxiety.  Please follow-up with your primary care provider for further evaluation as needed.  I have provided contact information for the behavioral urgent care if you have any urgent behavioral health needs.  If develop any life-threatening symptoms please return to the emergency department.

## 2023-09-24 NOTE — ED Triage Notes (Signed)
Pt arrived POV for feeling anxious and having a headache, jaw pain, bilateral tingling to arms and hands. Reports HR 130 on apple watch when having a panic attack driving home from work. Pt reports at home resting felt palpitation and really anxious. Hx of panic attacks and anxiety. VSS, NAD noted.

## 2023-09-24 NOTE — ED Notes (Signed)
A gold top and dark green was sent down with pt's second troponin for extra labs if needed.

## 2023-09-24 NOTE — ED Provider Notes (Signed)
[place] EMERGENCY DEPARTMENT AT [facility] Provider Note   CSN: [id number] Arrival date & time: 09/24/23  0018     History  Chief Complaint  Patient presents with   Anxiety    Edwin Maldonado is a 27 y.o. male.  Patient with past medical history significant for anxiety, panic attacks, childhood seizures presents to the emergency department with a chief complaint of heart palpitations, mild headache, tingling to arms and hands.  He states he was driving home from work when he began to feel that his heart was pounding and his watch showed a heart rate of 120.  He states he pulled into a convenience store and got a drink and continue to watch his heart rate, downtrending to 110.  Upon arriving home he tried to go to bed but noticed that his heart rate was still at approximately 90 and then he began to feel some pains in the left side of his chest.  He came to the emergency department for evaluation.  Currently he denies chest pain or shortness of breath.  He states he never had shortness of breath associated with the event.  He does endorse history of anxiety and history of being evaluated before for atypical chest pains.  He was prescribed Atarax in the past but states that it made him feel very sick and he was unable to take it and go to work.  He has not been evaluated in the past by behavioral health.   Anxiety       Home Medications Prior to Admission medications   Not on File      Allergies    Other    Review of Systems   Review of Systems  Physical Exam Updated Vital Signs BP (!) 132/91 (BP Location: Right Arm)   Pulse 62   Temp (!) 97.5 F (36.4 C) (Oral)   Resp 16   Ht 6\' 1"  (1.854 m)   Wt 88.5 kg   SpO2 100%   BMI 25.73 kg/m  Physical Exam Vitals and nursing note reviewed.  Constitutional:      General: He is not in acute distress.    Appearance: He is well-developed.  HENT:     Head: Normocephalic and atraumatic.  Eyes:      Conjunctiva/sclera: Conjunctivae normal.  Cardiovascular:     Rate and Rhythm: Normal rate and regular rhythm.  Pulmonary:     Effort: Pulmonary effort is normal. No respiratory distress.     Breath sounds: Normal breath sounds.  Musculoskeletal:        General: No swelling.     Cervical back: Neck supple.  Skin:    General: Skin is warm and dry.     Capillary Refill: Capillary refill takes less than 2 seconds.  Neurological:     Mental Status: He is alert.  Psychiatric:     Comments: Patient appears anxious     ED Results / Procedures / Treatments   Labs (all labs ordered are listed, but only abnormal results are displayed) Labs Reviewed  BASIC METABOLIC PANEL - Abnormal; Notable for the following components:      Result Value   Potassium 3.3 (*)    Glucose, Bld 112 (*)    All other components within normal limits  CBC  TROPONIN I (HIGH SENSITIVITY)  TROPONIN I (HIGH SENSITIVITY)    EKG None  Radiology DG Chest 2 View  Result Date: 09/24/2023 CLINICAL DATA:  Panic attack about an hour ago. Chest feels  tired and short of breath. EXAM: CHEST - 2 VIEW COMPARISON:  Radiograph 03/09/2023 FINDINGS: The heart size and mediastinal contours are within normal limits. Both lungs are clear. The visualized skeletal structures are unremarkable. IMPRESSION: No active cardiopulmonary disease. Electronically Signed   By: Minerva Fester M.D.   On: 09/24/2023 01:57    Procedures Procedures    Medications Ordered in ED Medications - No data to display  ED Course/ Medical Decision Making/ A&P                                 Medical Decision Making Amount and/or Complexity of Data Reviewed Labs: ordered. Radiology: ordered.   This patient presents to the ED for concern of palpitations, this involves an extensive number of treatment options, and is a complaint that carries with it a high risk of complications and morbidity.  The differential diagnosis includes ACS, anxiety, PE,  others   Co morbidities that complicate the patient evaluation  History of anxiety, panic attacks, chest pain   Additional history obtained:   External records from outside source obtained and reviewed including primary care notes from November 2023 who patient was seen for his annual physical exam and there was no mention of anxiety in the note   Lab Tests:  I Ordered, and personally interpreted labs.  The pertinent results include: Troponin 4 with no delta change, potassium 3 3, unremarkable CBC   Imaging Studies ordered:  I ordered imaging studies including Chest x-ray  I independently visualized and interpreted imaging which showed no active disease I agree with the radiologist interpretation   Cardiac Monitoring: / EKG:  The patient was maintained on a cardiac monitor.  I personally viewed and interpreted the cardiac monitored which showed an underlying rhythm of: Sinus rhythm   Test / Admission - Considered:  Patient complaining of palpitations noted when driving home from work.  Patient with history of anxiety and panic attacks.  Troponins negative x 2 with a nonischemic EKG.  No signs of ACS.  Patient not tachycardic and not complaining of shortness of breath.  No signs of pulmonary embolism at this time.  Chest x-ray unremarkable.  Mild hypokalemia with potassium of 3.3.  Symptoms seem most consistent with patient's history of anxiety.  Plan to discharge home at this time with recommendations for follow-up with primary care.  Patient also advised of the behavioral urgent care.  Return precautions provided.  Discharged home.         Final Clinical Impression(s) / ED Diagnoses Final diagnoses:  Chest pain, unspecified type  Anxiety    Rx / DC Orders ED Discharge Orders     None         Pamala Duffel 09/24/23 0430    Tilden Fossa, MD 09/24/23 [phone]

## 2023-10-05 ENCOUNTER — Encounter (HOSPITAL_COMMUNITY): Payer: Self-pay | Admitting: *Deleted

## 2023-10-05 ENCOUNTER — Other Ambulatory Visit: Payer: Self-pay

## 2023-10-05 ENCOUNTER — Emergency Department (HOSPITAL_COMMUNITY)
Admission: EM | Admit: 2023-10-05 | Discharge: 2023-10-05 | Disposition: A | Discharge status: Home / Self Care | Payer: BC Managed Care – PPO | Attending: Emergency Medicine | Admitting: Emergency Medicine

## 2023-10-05 DIAGNOSIS — R002 Palpitations: Secondary | ICD-10-CM | POA: Diagnosis present

## 2023-10-05 DIAGNOSIS — F410 Panic disorder [episodic paroxysmal anxiety] without agoraphobia: Secondary | ICD-10-CM | POA: Diagnosis not present

## 2023-10-05 LAB — BASIC METABOLIC PANEL
Anion gap: 10 (ref 5–15)
BUN: 14 mg/dL (ref 6–20)
CO2: 24 mmol/L (ref 22–32)
Calcium: 9.1 mg/dL (ref 8.9–10.3)
Chloride: 101 mmol/L (ref 98–111)
Creatinine, Ser: 0.78 mg/dL (ref 0.61–1.24)
GFR, Estimated: >60 mL/min (ref >60)
Glucose, Bld: 101 mg/dL — ABNORMAL HIGH (ref 70–99)
Potassium: 3.4 mmol/L — ABNORMAL LOW (ref 3.5–5.1)
Sodium: 135 mmol/L (ref 135–145)

## 2023-10-05 LAB — CBC WITH DIFFERENTIAL/PLATELET
Abs Immature Granulocytes: 0.01 10*3/uL (ref 0.00–0.07)
Basophils Absolute: 0 10*3/uL (ref 0.0–0.1)
Basophils Relative: 0 %
Eosinophils Absolute: 0.1 10*3/uL (ref 0.0–0.5)
Eosinophils Relative: 2 %
HCT: 43.2 % (ref 39.0–52.0)
Hemoglobin: 15.2 g/dL (ref 13.0–17.0)
Immature Granulocytes: 0 %
Lymphocytes Relative: 47 %
Lymphs Abs: 3.3 10*3/uL (ref 0.7–4.0)
MCH: 30.2 pg (ref 26.0–34.0)
MCHC: 35.2 g/dL (ref 30.0–36.0)
MCV: 85.7 fL (ref 80.0–100.0)
Monocytes Absolute: 0.7 10*3/uL (ref 0.1–1.0)
Monocytes Relative: 10 %
Neutro Abs: 2.8 10*3/uL (ref 1.7–7.7)
Neutrophils Relative %: 41 %
Platelets: 205 10*3/uL (ref 150–400)
RBC: 5.04 MIL/uL (ref 4.22–5.81)
RDW: 12.2 % (ref 11.5–15.5)
WBC: 6.9 10*3/uL (ref 4.0–10.5)
nRBC: 0 % (ref 0.0–0.2)

## 2023-10-05 LAB — TROPONIN I (HIGH SENSITIVITY): Troponin I (High Sensitivity): 5 ng/L (ref <18)

## 2023-10-05 MED ORDER — POTASSIUM CHLORIDE CRYS ER 20 MEQ PO TBCR: 40.0000 meq | EXTENDED_RELEASE_TABLET | Freq: Once | ORAL | Status: DC

## 2023-10-05 MED ORDER — ALPRAZOLAM 0.5 MG PO TABS
0.5000 mg | ORAL_TABLET | Freq: Once | ORAL | Status: AC
  Administered 2023-10-05: 0.5 mg via ORAL
  Filled 2023-10-05: qty 1

## 2023-10-05 NOTE — ED Triage Notes (Signed)
Pt states that he feels like he woke up and had an "out of body experience, the entire upper half of my body is on fire". He is reporting palpitations, no pain.

## 2023-10-05 NOTE — ED Notes (Addendum)
6:17 AM  Patient reports having a "burning and hot" sensation radiating across chest wall, arms, hands, and upper abdomen. States that the sensation woke him from his sleep x 3 times. He denies CP or pressure, SOB, vomiting, fevers, chills, abdominal pain, HA, numbness, tingling. He does endorse having some dizziness/lightheadedness and feeling like his heart is "skipping beats." He is alert and oriented x 4. He has equal rise and fall of the chest wall with clear lung sounds. Cap refill <2 secs. No edema noted. He denies pain at this time. Hx anxiety. Reports that he did take sertraline daily. Call light in reach. Bed in lowest position. IV started and labs drawn and sent. Pending lab results.   6:30 AM  Patient medicated per order.

## 2023-10-05 NOTE — ED Provider Notes (Signed)
Signout received on this 27 year old male who presented with palpitations.  Please see previous note for full details.  At the time of signout he is awaiting BMP, and troponin.  If these are reassuring and his symptoms have improved after his Xanax dose he is appropriate for discharge to follow-up with PCP. Physical Exam  BP (!) 142/92 (BP Location: Right Arm)   Pulse 73   Temp 98 F (36.7 C) (Oral)   Resp 12   SpO2 100%     Procedures  Procedures  ED Course / MDM    Medical Decision Making Amount and/or Complexity of Data Reviewed Labs: ordered.  Risk Prescription drug management.   Symptoms significantly improved.  Workup all reassuring.  Potassium of 3.4.  Will give p.o. repletion.  He is stable for discharge.  Discharged in stable condition.  Return precautions discussed.       Marita Kansas, PA-C 10/05/23 4098    Bethann Berkshire, MD 10/06/23 1218

## 2023-10-05 NOTE — ED Provider Notes (Signed)
[place] EMERGENCY DEPARTMENT AT [facility] Provider Note   CSN: [id number] Arrival date & time: 10/05/23  2841     History  No chief complaint on file.   Edwin Maldonado is a 27 y.o. male.  Patient with past medical history significant for anxiety, panic attacks, seizures presents to the Emergency Department complaining of waking up with a "out of body experience".  He states that the upper half of his body feels like it is on fire.  He feels palpitations but denies chest pain, shortness of breath, abdominal pain, nausea, vomiting.  The patient was recently seen in the emergency department for what seemed consistent with anxiety and followed up with primary care.  He was prescribed Zoloft and Xanax.  He states he was taking Zoloft but has not been taking his Xanax.  HPI     Home Medications Prior to Admission medications   Medication Sig Start Date End Date Taking? Authorizing Provider  sertraline (ZOLOFT) 25 MG tablet Take 25 mg by mouth daily. 09/29/23  Yes [provider]  ALPRAZolam Prudy Feeler) 0.5 MG tablet Take 0.5 mg by mouth 3 (three) times daily as needed for anxiety. Patient not taking: Reported on 10/05/2023 09/29/23 09/28/24  [provider]      Allergies    Other    Review of Systems   Review of Systems  Physical Exam Updated Vital Signs BP (!) 142/92 (BP Location: Right Arm)   Pulse 73   Temp 98 F (36.7 C) (Oral)   Resp 12   SpO2 100%  Physical Exam Vitals and nursing note reviewed.  HENT:     Head: Normocephalic and atraumatic.  Eyes:     Conjunctiva/sclera: Conjunctivae normal.  Cardiovascular:     Rate and Rhythm: Normal rate and regular rhythm.  Pulmonary:     Effort: Pulmonary effort is normal. No respiratory distress.  Musculoskeletal:        General: No signs of injury.     Cervical back: Normal range of motion.  Skin:    General: Skin is dry.  Neurological:     Mental Status: He is alert.  Psychiatric:         Speech: Speech normal.     ED Results / Procedures / Treatments   Labs (all labs ordered are listed, but only abnormal results are displayed) Labs Reviewed  CBC WITH DIFFERENTIAL/PLATELET  BASIC METABOLIC PANEL  TROPONIN I (HIGH SENSITIVITY)    EKG None  Radiology No results found.  Procedures Procedures    Medications Ordered in ED Medications  ALPRAZolam Prudy Feeler) tablet 0.5 mg (0.5 mg Oral Given 10/05/23 3244)    ED Course/ Medical Decision Making/ A&P                                 Medical Decision Making  This patient presents to the ED for concern of palpitations and "out of body sensation", this involves an extensive number of treatment options, and is a complaint that carries with it a high risk of complications and morbidity.  The differential diagnosis includes anxiety attack, dysrhythmia, nerve pain, others   Co morbidities that complicate the patient evaluation  Anxiety   Additional history obtained:   External records from outside source obtained and reviewed including primary care notes.  Patient followed up after most recent emergency department visit in September and primary care prescribed both Zoloft and Xanax.   Lab Tests:  I Ordered labs including a BMP, CBC, and troponin.  Lab results are pending at time of shift handoff.   Cardiac Monitoring: / EKG:  The patient was maintained on a cardiac monitor.  I personally viewed and interpreted the cardiac monitored which showed an underlying rhythm of: Sinus tachycardia with a rate of 100   Problem List / ED Course / Critical interventions / Medication management   I ordered medication including Xanax for anxiety   Test / Admission - Considered:  Patient's initial presentation most consistent with an anxiety attack.  Patient does not take the Xanax that was prescribed by his primary care provider.  I have ordered a dose of Xanax for the patient.  EKG showed a sinus tachycardia with a rate  of 100 with no signs of ischemia and no dysrhythmia.  Lab work is pending at this time.  Patient  disposition pending reassessment and results of lab work.  Anticipate discharge home.  Patient care being transferred to Marita Kansas, PA-C at shift handoff.         Final Clinical Impression(s) / ED Diagnoses Final diagnoses:  None    Rx / DC Orders ED Discharge Orders     None         Pamala Duffel 10/05/23 4098    Nira Conn, MD 10/06/23 [phone]

## 2023-10-05 NOTE — Discharge Instructions (Signed)
Your workup is reassuring.  Return for any concerning symptoms.  Follow-up with your primary care provider.

## 2024-07-07 ENCOUNTER — Other Ambulatory Visit: Payer: Self-pay

## 2024-07-07 ENCOUNTER — Encounter: Payer: Self-pay | Admitting: Emergency Medicine

## 2024-07-07 ENCOUNTER — Emergency Department
Admission: EM | Admit: 2024-07-07 | Discharge: 2024-07-07 | Disposition: A | Discharge status: Home / Self Care | Attending: Emergency Medicine | Admitting: Emergency Medicine

## 2024-07-07 DIAGNOSIS — T782XXA Anaphylactic shock, unspecified, initial encounter: Secondary | ICD-10-CM | POA: Insufficient documentation

## 2024-07-07 DIAGNOSIS — T7840XA Allergy, unspecified, initial encounter: Secondary | ICD-10-CM | POA: Diagnosis present

## 2024-07-07 MED ORDER — METHYLPREDNISOLONE SODIUM SUCC 125 MG IJ SOLR
125.0000 mg | Freq: Once | INTRAMUSCULAR | Status: AC
  Administered 2024-07-07: 125 mg via INTRAVENOUS
  Filled 2024-07-07: qty 2

## 2024-07-07 MED ORDER — PREDNISONE 50 MG PO TABS: 50.0000 mg | ORAL_TABLET | Freq: Every day | ORAL | 0 refills | Status: AC

## 2024-07-07 MED ORDER — EPINEPHRINE 0.3 MG/0.3ML IJ SOAJ
0.3000 mg | Freq: Once | INTRAMUSCULAR | Status: AC
  Administered 2024-07-07: 0.3 mg via INTRAMUSCULAR

## 2024-07-07 MED ORDER — EPINEPHRINE 0.3 MG/0.3ML IJ SOAJ
INTRAMUSCULAR | Status: AC
  Filled 2024-07-07: qty 0.3

## 2024-07-07 MED ORDER — EPINEPHRINE 0.3 MG/0.3ML IJ SOAJ: 0.3000 mg | INTRAMUSCULAR | 2 refills | Status: AC | PRN

## 2024-07-07 MED ORDER — FAMOTIDINE IN NACL 20-0.9 MG/50ML-% IV SOLN
20.0000 mg | Freq: Once | INTRAVENOUS | Status: AC
  Administered 2024-07-07: 20 mg via INTRAVENOUS
  Filled 2024-07-07: qty 50

## 2024-07-07 MED ORDER — DIPHENHYDRAMINE HCL 50 MG/ML IJ SOLN
25.0000 mg | Freq: Once | INTRAMUSCULAR | Status: AC
  Administered 2024-07-07: 25 mg via INTRAVENOUS
  Filled 2024-07-07: qty 1

## 2024-07-07 NOTE — ED Provider Notes (Signed)
 Patient observed for nearly 4 hours with improvement of symptoms and no recurrence of anaphylaxis to indicate repeat dosing of epinephrine .  Some mild residual hives, we discussed additional antihistamines but he would prefer to go home and take them at his house.  We discussed expectant management, prednisone  and EpiPen  prescriptions, antihistamines at home, ED return precautions.  He is suitable for outpatient management   Claudene Rover, MD 07/07/24 [phone]

## 2024-07-07 NOTE — Discharge Instructions (Addendum)
 Use Benadryl  (diphenhydramine ) at home for the itchy rash (hives) under the skin.  Use 50 mg (2 tablets) per dose 3-4 times per day.  If your symptoms worsen despite this and you need to give yourself an EpiPen  then please return to the ED  Prednisone  steroids once daily for the next few days

## 2024-07-07 NOTE — ED Triage Notes (Signed)
 Pt arrives to ER via POV with c/o allergic reaction.  Pt with noted hives to entire body.  States started approximately 20-30 mins ago.  Pt states he feels like it is getting harder to breath.

## 2024-07-07 NOTE — ED Provider Notes (Signed)
 [facility] Provider Note    Event Date/Time   First MD Initiated Contact with Patient 07/07/24 1329     (approximate)   History   Allergic Reaction   HPI  Edwin Maldonado is a 28 y.o. male presents to the emergency department with concern for an allergic reaction.  Patient states that he was at work and then went to cookout and afterwards started to break out in an itchy rash.  Complaining of shortness of breath.  Denies nausea or vomiting or diarrhea or abdominal pain.  No prior similar episodes.  Does not take any daily medications.  No over-the-counter medications.  Does not believe that he has been stung by anything.  No new foods that he is aware of.  States that he ate a cheeseburger, chicken quesadilla and fries.  States that he is eaten this multiple times in the past.  Endorses some mild itching over the past 1 month but no significant other symptoms or a rash.  No known tick exposure.     Physical Exam   Triage Vital Signs: ED Triage Vitals  Encounter Vitals Group     BP 07/07/24 1318 130/72     Girls Systolic BP Percentile --      Girls Diastolic BP Percentile --      Boys Systolic BP Percentile --      Boys Diastolic BP Percentile --      Pulse Rate 07/07/24 1318 (!) 140     Resp 07/07/24 1318 (!) 24     Temp 07/07/24 1318 98.4 F (36.9 C)     Temp src --      SpO2 07/07/24 1318 99 %     Weight 07/07/24 1319 205 lb (93 kg)     Height 07/07/24 1319 6' 2 (1.88 m)     Head Circumference --      Peak Flow --      Pain Score 07/07/24 1319 0     Pain Loc --      Pain Education --      Exclude from Growth Chart --     Most recent vital signs: Vitals:   07/07/24 1400 07/07/24 1430  BP: 117/64 110/61  Pulse: 86 90  Resp: 16 10  Temp:    SpO2: 100% 100%    Physical Exam Constitutional:      Appearance: He is well-developed.  HENT:     Head: Atraumatic.  Eyes:     Conjunctiva/sclera: Conjunctivae normal.  Cardiovascular:      Rate and Rhythm: Regular rhythm. Tachycardia present.  Pulmonary:     Effort: No respiratory distress.  Abdominal:     Tenderness: There is no abdominal tenderness.  Musculoskeletal:     Cervical back: Normal range of motion.  Skin:    General: Skin is warm.     Findings: Rash present.     Comments: Diffuse urticarial rash  Neurological:     Mental Status: He is alert. Mental status is at baseline.     IMPRESSION / MDM / ASSESSMENT AND PLAN / ED COURSE  I reviewed the triage vital signs and the nursing notes.  Clinical picture concerning for anaphylactic reaction.  Patient with shortness of breath and diffuse urticaria.  Uncertain of what is causing his allergic reaction.   No tachycardic or bradycardic dysrhythmias while on cardiac telemetry. LABS (all labs ordered are listed, but only abnormal results are displayed) Labs interpreted as -    Labs Reviewed -  No data to display   MDM  Given epi, Solu-Medrol , Pepcid  and Benadryl  for anaphylaxis.  Placed on cardiac telemetry.  Clinical Course as of 07/07/24 1450  Thu Jul 07, 2024  1445 On reevaluation significant improvement of the symptoms.  No longer with shortness of breath.  Significant improvement of his urticaria but continues to have some urticaria to bilateral upper extremities. [SM]    Clinical Course User Index [SM] Suzanne Kirsch, MD   Discussed close follow-up with primary care physician and outpatient allergy testing.  States that he will follow-up with his primary care physician for further allergy testing.  Discussed treatment for anaphylaxis and will give an EpiPen .  PROCEDURES:  Critical Care performed: yes  .Critical Care  Performed by: Suzanne Kirsch, MD Authorized by: Suzanne Kirsch, MD   Critical care provider statement:    Critical care time (minutes):  30   Critical care time was exclusive of:  Separately billable procedures and treating other patients   Critical care was necessary to treat  or prevent imminent or life-threatening deterioration of the following conditions:  Circulatory failure   Critical care was time spent personally by me on the following activities:  Development of treatment plan with patient or surrogate, discussions with consultants, evaluation of patient's response to treatment, examination of patient, ordering and review of laboratory studies, ordering and review of radiographic studies, ordering and performing treatments and interventions, pulse oximetry, re-evaluation of patient's condition and review of old charts   Patient's presentation is most consistent with acute presentation with potential threat to life or bodily function.   MEDICATIONS ORDERED IN ED: Medications  EPINEPHrine  (EPI-PEN) 0.3 mg/0.3 mL injection (has no administration in time range)  EPINEPHrine  (EPI-PEN) injection 0.3 mg (0.3 mg Intramuscular Given 07/07/24 1327)  diphenhydrAMINE  (BENADRYL ) injection 25 mg (25 mg Intravenous Given 07/07/24 1338)  methylPREDNISolone  sodium succinate (SOLU-MEDROL ) 125 mg/2 mL injection 125 mg (125 mg Intravenous Given 07/07/24 1338)  famotidine  (PEPCID ) IVPB 20 mg premix (0 mg Intravenous Stopped 07/07/24 1428)    FINAL CLINICAL IMPRESSION(S) / ED DIAGNOSES   Final diagnoses:  Anaphylaxis, initial encounter     Rx / DC Orders   ED Discharge Orders          Ordered    EPINEPHrine  0.3 mg/0.3 mL IJ SOAJ injection  As needed        07/07/24 1448    predniSONE  (DELTASONE ) 50 MG tablet  Daily with breakfast        07/07/24 1448             Note:  This document was prepared using Dragon voice recognition software and may include unintentional dictation errors.   Suzanne Kirsch, MD 07/07/24 1450

## 2024-07-09 ENCOUNTER — Other Ambulatory Visit: Payer: Self-pay

## 2024-07-09 ENCOUNTER — Encounter: Payer: Self-pay | Admitting: Emergency Medicine

## 2024-07-09 ENCOUNTER — Emergency Department

## 2024-07-09 ENCOUNTER — Emergency Department
Admission: EM | Admit: 2024-07-09 | Discharge: 2024-07-09 | Disposition: A | Discharge status: Home / Self Care | Attending: Emergency Medicine | Admitting: Emergency Medicine

## 2024-07-09 DIAGNOSIS — R079 Chest pain, unspecified: Secondary | ICD-10-CM | POA: Diagnosis present

## 2024-07-09 LAB — COMPREHENSIVE METABOLIC PANEL WITH GFR
ALT: 32 U/L (ref 0–44)
AST: 22 U/L (ref 15–41)
Albumin: 3.9 g/dL (ref 3.5–5.0)
Alkaline Phosphatase: 56 U/L (ref 38–126)
Anion gap: 9 (ref 5–15)
BUN: 23 mg/dL — ABNORMAL HIGH (ref 6–20)
CO2: 25 mmol/L (ref 22–32)
Calcium: 8.9 mg/dL (ref 8.9–10.3)
Chloride: 104 mmol/L (ref 98–111)
Creatinine, Ser: 0.79 mg/dL (ref 0.61–1.24)
GFR, Estimated: >60 mL/min (ref >60)
Glucose, Bld: 87 mg/dL (ref 70–99)
Potassium: 4.1 mmol/L (ref 3.5–5.1)
Sodium: 138 mmol/L (ref 135–145)
Total Bilirubin: 0.8 mg/dL (ref 0.0–1.2)
Total Protein: 7.1 g/dL (ref 6.5–8.1)

## 2024-07-09 LAB — CBC WITH DIFFERENTIAL/PLATELET
Abs Immature Granulocytes: 0.01 K/uL (ref 0.00–0.07)
Basophils Absolute: 0 K/uL (ref 0.0–0.1)
Basophils Relative: 0 %
Eosinophils Absolute: 0 K/uL (ref 0.0–0.5)
Eosinophils Relative: 1 %
HCT: 43 % (ref 39.0–52.0)
Hemoglobin: 14.7 g/dL (ref 13.0–17.0)
Immature Granulocytes: 0 %
Lymphocytes Relative: 51 %
Lymphs Abs: 3.3 K/uL (ref 0.7–4.0)
MCH: 30.2 pg (ref 26.0–34.0)
MCHC: 34.2 g/dL (ref 30.0–36.0)
MCV: 88.3 fL (ref 80.0–100.0)
Monocytes Absolute: 0.8 K/uL (ref 0.1–1.0)
Monocytes Relative: 12 %
Neutro Abs: 2.3 K/uL (ref 1.7–7.7)
Neutrophils Relative %: 36 %
Platelets: 191 K/uL (ref 150–400)
RBC: 4.87 MIL/uL (ref 4.22–5.81)
RDW: 12.6 % (ref 11.5–15.5)
WBC: 6.4 K/uL (ref 4.0–10.5)
nRBC: 0 % (ref 0.0–0.2)

## 2024-07-09 LAB — LIPASE, BLOOD: Lipase: 39 U/L (ref 11–51)

## 2024-07-09 LAB — TROPONIN I (HIGH SENSITIVITY)
Troponin I (High Sensitivity): 6 ng/L (ref <18)
Troponin I (High Sensitivity): 5 ng/L (ref <18)

## 2024-07-09 LAB — D-DIMER, QUANTITATIVE: D-Dimer, Quant: 0.41 ug{FEU}/mL (ref 0.00–0.50)

## 2024-07-09 MED ORDER — ALUM & MAG HYDROXIDE-SIMETH 200-200-20 MG/5ML PO SUSP
30.0000 mL | Freq: Once | ORAL | Status: AC
  Administered 2024-07-09: 30 mL via ORAL
  Filled 2024-07-09: qty 30

## 2024-07-09 NOTE — ED Triage Notes (Signed)
 Pt via POV from home. Pt c/o L sided CP that started yesterday, Reports it radiates to the L shoulder. States he feels like he has a to burp but he can't. Reports mild SOB. Denies any cough/congestion. Denies cardiac hx. Pt was recently here on Thursday for allergic reaction. Pt is A&OX4 and NAD, ambulatory to triage.

## 2024-07-09 NOTE — ED Provider Notes (Signed)
 [facility] Provider Note    Event Date/Time   First MD Initiated Contact with Patient 07/09/24 1113     (approximate)   History   Chest Pain   HPI  Edwin Maldonado is a 28 y.o. male with a past medical history of vaping who presents today for evaluation of chest pain.  Patient reports that this has been ongoing for the past 2 days.  He reports that his symptoms began the day after he was seen here for anaphylaxis and received epinephrine , Solu-Medrol , Pepcid , and Benadryl  and monitored for 4 hours.  He was discharged with an EpiPen  and prednisone .  Patient reports that his symptoms began the following day and he feels burning within his chest that radiates to his left arm.  He reports that the symptoms are worse when laying down and he had to take Tums last night which provided limited improvement of his symptoms.  He denies shortness of breath.  He feels that his symptoms are worse with deep inspiration.  No abdominal pain.  No fevers or chills.  There are no active problems to display for this patient.         Physical Exam   Triage Vital Signs: ED Triage Vitals  Encounter Vitals Group     BP 07/09/24 1002 133/83     Girls Systolic BP Percentile --      Girls Diastolic BP Percentile --      Boys Systolic BP Percentile --      Boys Diastolic BP Percentile --      Pulse Rate 07/09/24 1002 77     Resp 07/09/24 1002 18     Temp 07/09/24 1002 97.9 F (36.6 C)     Temp Source 07/09/24 1002 Oral     SpO2 07/09/24 1002 98 %     Weight 07/09/24 0959 205 lb (93 kg)     Height 07/09/24 0959 6' 2 (1.88 m)     Head Circumference --      Peak Flow --      Pain Score 07/09/24 0959 1     Pain Loc --      Pain Education --      Exclude from Growth Chart --     Most recent vital signs: Vitals:   07/09/24 1211 07/09/24 1450  BP: 139/79 (!) 130/97  Pulse: 63 65  Resp: 16 18  Temp: 97.9 F (36.6 C) 97.8 F (36.6 C)  SpO2: 100% 100%    Physical  Exam Vitals and nursing note reviewed.  Constitutional:      General: Awake and alert. No acute distress.    Appearance: Normal appearance. The patient is normal weight.  HENT:     Head: Normocephalic and atraumatic.     Mouth: Mucous membranes are moist.  Eyes:     General: PERRL. Normal EOMs        Right eye: No discharge.        Left eye: No discharge.     Conjunctiva/sclera: Conjunctivae normal.  Cardiovascular:     Rate and Rhythm: Normal rate and regular rhythm.     Pulses: Normal pulses.  Pulmonary:     Effort: Pulmonary effort is normal. No respiratory distress.     Breath sounds: Normal breath sounds.  Abdominal:     Abdomen is soft. There is no abdominal tenderness. No rebound or guarding. No distention. Musculoskeletal:        General: No swelling. Normal range of motion.  Cervical back: Normal range of motion and neck supple.  Skin:    General: Skin is warm and dry.     Capillary Refill: Capillary refill takes less than 2 seconds.     Findings: No rash.  Neurological:     Mental Status: The patient is awake and alert.      ED Results / Procedures / Treatments   Labs (all labs ordered are listed, but only abnormal results are displayed) Labs Reviewed  COMPREHENSIVE METABOLIC PANEL WITH GFR - Abnormal; Notable for the following components:      Result Value   BUN 23 (*)    All other components within normal limits  CBC WITH DIFFERENTIAL/PLATELET  LIPASE, BLOOD  D-DIMER, QUANTITATIVE  TROPONIN I (HIGH SENSITIVITY)  TROPONIN I (HIGH SENSITIVITY)     EKG     RADIOLOGY I independently reviewed and interpreted imaging and agree with radiologists findings.     PROCEDURES:  Critical Care performed:   Procedures   MEDICATIONS ORDERED IN ED: Medications  alum & mag hydroxide-simeth (MAALOX/MYLANTA) 200-200-20 MG/5ML suspension 30 mL (30 mLs Oral Given 07/09/24 1325)     IMPRESSION / MDM / ASSESSMENT AND PLAN / ED COURSE  I reviewed the  triage vital signs and the nursing notes.   Differential diagnosis includes, but is not limited to, gastritis, peptic ulcer disease, pancreatitis, esophageal spasm, acute coronary syndrome, pulmonary embolism less likely.  Patient is awake and alert, hemodynamically stable and afebrile.  He is a normal oxygen saturation 100% on room air.  EKG obtained at the time of triage demonstrates no acute ischemia, also reviewed by attending MD.  Further workup is indicated.  Labs including 2 troponins obtained, and are overall reassuring.  D-dimer obtained given that he reported pleuritic component to his chest pain, that this was negative as well.  He has no tachycardia or hypoxia, no clinical signs or symptoms of DVT to suggest PE as a source of his symptoms today.  His chest x-ray shows no cardiopulmonary abnormality.  He was given a GI cocktail given his reported burning, and this resulted in significant improvement of his symptoms.  I suspect the possible component of gastritis secondary to the Solu-Medrol  that he was given when he had the allergic reaction, especially given his complete resolution of pain with a GI cocktail.  He has no exertional component to his chest pain, no associated nausea or diaphoresis, not consistent with acute coronary syndrome.  His heart score is 0.  We discussed return precautions and the importance of close outpatient follow-up.  Patient understands and agrees with plan.  He was discharged in stable condition.   Patient's presentation is most consistent with acute presentation with potential threat to life or bodily function.   Clinical Course as of 07/09/24 1647  Sat Jul 09, 2024  1444 Patient reports that pain had resolved after the GI cocktail [JP]    Clinical Course User Index [JP] Janmichael Giraud E, PA-C     FINAL CLINICAL IMPRESSION(S) / ED DIAGNOSES   Final diagnoses:  Nonspecific chest pain     Rx / DC Orders   ED Discharge Orders     None         Note:  This document was prepared using Dragon voice recognition software and may include unintentional dictation errors.   Karin Pinedo E, PA-C 07/09/24 1647    Dicky Anes, MD 07/23/24 [phone]

## 2024-07-09 NOTE — Discharge Instructions (Addendum)
 Your blood work and chest x-ray are normal.  You were given Mylanta with improvement of your symptoms.  This is something that you can also take over-the-counter.  Please return for any new, worsening, or change in symptoms or other concerns.  It was a pleasure caring for you today.

## 2024-07-09 NOTE — ED Notes (Signed)
 PA at bedside.

## 2024-07-09 NOTE — ED Notes (Signed)
 Pt reports heart burn Poggi, PA made aware received new orders

## 2024-07-25 NOTE — Progress Notes (Unsigned)
 New Patient Note  RE: Edwin Maldonado MRN: [id number] DOB: [birth year] Date of Office Visit: 07/26/2024  Consult requested by: Celestia Harder, NP Primary care provider: Celestia Harder, NP  Chief Complaint: No chief complaint on file.  History of Present Illness: I had the pleasure of seeing Edwin Maldonado for initial evaluation at the [facility] on 07/26/2024. He is a 28 y.o. male, who is referred here by Celestia Harder, NP for the evaluation of ***.  Discussed the use of AI scribe software for clinical note transcription with the patient, who gave verbal consent to proceed.  History of Present Illness             ***  Assessment and Plan: Edwin Maldonado is a 28 y.o. male with: ***  Assessment and Plan               No follow-ups on file.  No orders of the defined types were placed in this encounter.  Lab Orders  No laboratory test(s) ordered today    Other allergy screening: Asthma: {Blank single:19197::yes,no} Rhino conjunctivitis: {Blank single:19197::yes,no} Food allergy: {Blank single:19197::yes,no} Medication allergy: {Blank single:19197::yes,no} Hymenoptera allergy: {Blank single:19197::yes,no} Urticaria: {Blank single:19197::yes,no} Eczema:{Blank single:19197::yes,no} History of recurrent infections suggestive of immunodeficency: {Blank single:19197::yes,no}  Diagnostics: Spirometry:  Tracings reviewed. His effort: {Blank single:19197::Good reproducible efforts.,It was hard to get consistent efforts and there is a question as to whether this reflects a maximal maneuver.,Poor effort, data can not be interpreted.} FVC: ***L FEV1: ***L, ***% predicted FEV1/FVC ratio: ***% Interpretation: {Blank single:19197::Spirometry consistent with mild obstructive disease,Spirometry consistent with moderate obstructive disease,Spirometry consistent with severe obstructive disease,Spirometry consistent with  possible restrictive disease,Spirometry consistent with mixed obstructive and restrictive disease,Spirometry uninterpretable due to technique,Spirometry consistent with normal pattern,No overt abnormalities noted given today's efforts}.  Please see scanned spirometry results for details.  Skin Testing: {Blank single:19197::Select foods,Environmental allergy panel,Environmental allergy panel and select foods,Food allergy panel,None,Deferred due to recent antihistamines use}. *** Results discussed with patient/family.   Past Medical History: There are no active problems to display for this patient.  Past Medical History:  Diagnosis Date  . Anxiety   . Heart murmur   . Panic attacks   . Seizures (HCC)    childhood   Past Surgical History: No past surgical history on file. Medication List:  Current Outpatient Medications  Medication Sig Dispense Refill  . ALPRAZolam  (XANAX ) 0.5 MG tablet Take 0.5 mg by mouth 3 (three) times daily as needed for anxiety. (Patient not taking: Reported on 10/05/2023)    . EPINEPHrine  0.3 mg/0.3 mL IJ SOAJ injection Inject 0.3 mg into the muscle as needed for anaphylaxis. 1 each 2  . sertraline (ZOLOFT) 25 MG tablet Take 25 mg by mouth daily.     No current facility-administered medications for this visit.   Allergies: Allergies  Allergen Reactions  . Other Other (See Comments)    Uncoded Allergy. Allergen: POISON OAK   Social History: Social History   Socioeconomic History  . Marital status: Single    Spouse name: Not on file  . Number of children: Not on file  . Years of education: Not on file  . Highest education level: Not on file  Occupational History  . Not on file  Tobacco Use  . Smoking status: Former    Types: Cigarettes  . Smokeless tobacco: Never  Vaping Use  . Vaping status: Every Day  . Substances: Nicotine, Flavoring  Substance and Sexual Activity  . Alcohol use: Yes  Comment: occasionally  . Drug  use: Yes    Types: Marijuana    Comment: last use 01/13/22  . Sexual activity: Not on file  Other Topics Concern  . Not on file  Social History Narrative  . Not on file   Social Drivers of Health   Financial Resource Strain: Low Risk  (09/24/2023)   Received from [facility]   Overall Financial Resource Strain (CARDIA)   . Difficulty of Paying Living Expenses: Not very hard  Food Insecurity: No Food Insecurity (09/24/2023)   Received from [facility]   Hunger Vital Sign   . Within the past 12 months, you worried that your food would run out before you got the money to buy more.: Never true   . Within the past 12 months, the food you bought just didn't last and you didn't have money to get more.: Never true  Transportation Needs: No Transportation Needs (09/24/2023)   Received from [facility] - Transportation   . Lack of Transportation (Medical): No   . Lack of Transportation (Non-Medical): No  Physical Activity: Sufficiently Active (09/24/2023)   Received from [facility]   Exercise Vital Sign   . On average, how many days per week do you engage in moderate to strenuous exercise (like a brisk walk)?: 5 days   . On average, how many minutes do you engage in exercise at this level?: 30 min  Stress: Stress Concern Present (09/24/2023)   Received from [facility] - Occupational Stress Questionnaire   . Feeling of Stress : Very much  Social Connections: Socially Isolated (09/24/2023)   Received from [facility]   Social Network   . How would you rate your social network (family, work, friends)?: Little participation, lonely and socially isolated   Lives in a ***. Smoking: *** Occupation: ***  Environmental HistorySurveyor, minerals in the house: Network engineer in the family room: {Blank single:19197::yes,no} Carpet in the bedroom: {Blank single:19197::yes,no} Heating: {Blank  single:19197::electric,gas,heat pump} Cooling: {Blank single:19197::central,window,heat pump} Pet: {Blank single:19197::yes ***,no}  Family History: No family history on file. Problem                               Relation Asthma                                   *** Eczema                                *** Food allergy                          *** Allergic rhino conjunctivitis     ***  Review of Systems  Constitutional:  Negative for appetite change, chills, fever and unexpected weight change.  HENT:  Negative for congestion and rhinorrhea.   Eyes:  Negative for itching.  Respiratory:  Negative for cough, chest tightness, shortness of breath and wheezing.   Cardiovascular:  Negative for chest pain.  Gastrointestinal:  Negative for abdominal pain.  Genitourinary:  Negative for difficulty urinating.  Skin:  Negative for rash.  Neurological:  Negative for headaches.    Objective: There were no vitals taken for this visit. There is no height or  weight on file to calculate BMI. Physical Exam Vitals and nursing note reviewed.  Constitutional:      Appearance: Normal appearance. He is well-developed.  HENT:     Head: Normocephalic and atraumatic.     Right Ear: Tympanic membrane and external ear normal.     Left Ear: Tympanic membrane and external ear normal.     Nose: Nose normal.     Mouth/Throat:     Mouth: Mucous membranes are moist.     Pharynx: Oropharynx is clear.  Eyes:     Conjunctiva/sclera: Conjunctivae normal.  Cardiovascular:     Rate and Rhythm: Normal rate and regular rhythm.     Heart sounds: Normal heart sounds. No murmur heard.    No friction rub. No gallop.  Pulmonary:     Effort: Pulmonary effort is normal.     Breath sounds: Normal breath sounds. No wheezing, rhonchi or rales.  Musculoskeletal:     Cervical back: Neck supple.  Skin:    General: Skin is warm.     Findings: No rash.  Neurological:     Mental Status: He is alert and  oriented to person, place, and time.  Psychiatric:        Behavior: Behavior normal.   The plan was reviewed with the patient/family, and all questions/concerned were addressed.  It was my pleasure to see Yurem today and participate in his care. Please feel free to contact me with any questions or concerns.  Sincerely,  Orlan Cramp, DO Allergy & Immunology  [facility]  [facility] office: [phone] [facility] office: [phone]

## 2024-07-26 ENCOUNTER — Other Ambulatory Visit: Payer: Self-pay

## 2024-07-26 ENCOUNTER — Ambulatory Visit: Payer: Self-pay | Admitting: Allergy

## 2024-07-26 ENCOUNTER — Encounter: Payer: Self-pay | Admitting: Allergy

## 2024-07-26 VITALS — BP 128/78 | HR 88 | Temp 98.7°F | Resp 18 | Ht 72.44 in | Wt 208.4 lb

## 2024-07-26 DIAGNOSIS — L509 Urticaria, unspecified: Secondary | ICD-10-CM

## 2024-07-26 DIAGNOSIS — J3089 Other allergic rhinitis: Secondary | ICD-10-CM

## 2024-07-26 DIAGNOSIS — T7840XD Allergy, unspecified, subsequent encounter: Secondary | ICD-10-CM

## 2024-07-26 DIAGNOSIS — T7840XA Allergy, unspecified, initial encounter: Secondary | ICD-10-CM

## 2024-07-26 NOTE — Patient Instructions (Addendum)
 Allergic reactions Unknown trigger. Avoid mammalian meat - no red meat for now.  For mild symptoms you can take over the counter antihistamines (zyrtec 10mg  to 20mg ) and monitor symptoms closely.  If symptoms worsen or if you have severe symptoms including breathing issues, throat closure, significant swelling, whole body hives, severe diarrhea and vomiting, lightheadedness then use epinephrine  and seek immediate medical care afterwards. Emergency action plan given.  Get bloodwork We are ordering labs, so please allow 1-2 weeks for the results to come back. With the newly implemented Cures Act, the labs might be visible to you at the same time that they become visible to me. However, I will not address the results until all of the results are back, so please be patient.  In the meantime, continue recommendations in your patient instructions, including avoidance measures (if applicable), until you hear from me. Hives: Start zyrtec (cetirizine) 10mg  OR allegra (fexofenadine) 180mg  once a day and may take twice a day if needed.  If symptoms are not controlled or causes drowsiness let us  know. Avoid the following potential triggers: alcohol, tight clothing, NSAIDs, hot showers and getting overheated. See below for proper skin care.   Return in about 4 weeks (around 08/23/2024). Or sooner if needed.   Skin care recommendations  Bath time: Always use lukewarm water. AVOID very hot or cold water. Keep bathing time to 5-10 minutes. Do NOT use bubble bath. Use a mild soap and use just enough to wash the dirty areas. Do NOT scrub skin vigorously.  After bathing, pat dry your skin with a towel. Do NOT rub or scrub the skin.  Moisturizers and prescriptions:  ALWAYS apply moisturizers immediately after bathing (within 3 minutes). This helps to lock-in moisture. Use the moisturizer several times a day over the whole body. Good summer moisturizers include: Aveeno, CeraVe, Cetaphil. Good winter  moisturizers include: Aquaphor, Vaseline, Cerave, Cetaphil, Eucerin, Vanicream. When using moisturizers along with medications, the moisturizer should be applied about one hour after applying the medication to prevent diluting effect of the medication or moisturize around where you applied the medications. When not using medications, the moisturizer can be continued twice daily as maintenance.  Laundry and clothing: Avoid laundry products with added color or perfumes. Use unscented hypo-allergenic laundry products such as Tide free, Cheer free & gentle, and All free and clear.  If the skin still seems dry or sensitive, you can try double-rinsing the clothes. Avoid tight or scratchy clothing such as wool. Do not use fabric softeners or dyer sheets.

## 2024-08-04 LAB — CHRONIC URTICARIA (CU) EVAL
ALT: 27 IU/L (ref 0–44)
Basophils Absolute: 0 x10E3/uL (ref 0.0–0.2)
Basos: 0 %
CRP: <1 mg/L (ref 0–10)
EOS (ABSOLUTE): 0 x10E3/uL (ref 0.0–0.4)
Eos: 1 %
Hematocrit: 44.1 % (ref 37.5–51.0)
Hemoglobin: 15 g/dL (ref 13.0–17.7)
Immature Grans (Abs): 0 x10E3/uL (ref 0.0–0.1)
Immature Granulocytes: 0 %
Lymphocytes Absolute: 1.9 x10E3/uL (ref 0.7–3.1)
Lymphs: 31 %
MCH: 30.5 pg (ref 26.6–33.0)
MCHC: 34 g/dL (ref 31.5–35.7)
MCV: 90 fL (ref 79–97)
Monocytes Absolute: 0.4 x10E3/uL (ref 0.1–0.9)
Monocytes: 6 %
Neutrophils Absolute: 3.9 x10E3/uL (ref 1.4–7.0)
Neutrophils: 62 %
Platelets: 220 x10E3/uL (ref 150–450)
Pooled Donor- BAT CU: 5.82 % (ref 0.00–10.60)
RBC: 4.92 x10E6/uL (ref 4.14–5.80)
RDW: 12.5 % (ref 11.6–15.4)
Sed Rate: 2 mm/h (ref 0–15)
TSH: 1.08 u[IU]/mL (ref 0.450–4.500)
Thyroperoxidase Ab SerPl-aCnc: <9 [IU]/mL (ref 0–34)
WBC: 6.2 x10E3/uL (ref 3.4–10.8)

## 2024-08-04 LAB — ALLERGENS W/TOTAL IGE AREA 2
Alternaria Alternata IgE: <0.1 kU/L
Aspergillus Fumigatus IgE: <0.1 kU/L
Bermuda Grass IgE: <0.1 kU/L
Cat Dander IgE: <0.1 kU/L
Cedar, Mountain IgE: <0.1 kU/L
Cladosporium Herbarum IgE: <0.1 kU/L
Cockroach, German IgE: 1.78 kU/L — AB
Common Silver Birch IgE: <0.1 kU/L
Cottonwood IgE: <0.1 kU/L
D Farinae IgE: 0.24 kU/L — AB
D Pteronyssinus IgE: 0.24 kU/L — AB
Dog Dander IgE: <0.1 kU/L
Elm, American IgE: <0.1 kU/L
Johnson Grass IgE: <0.1 kU/L
Maple/Box Elder IgE: <0.1 kU/L
Mouse Urine IgE: <0.1 kU/L
Oak, White IgE: <0.1 kU/L
Pecan, Hickory IgE: <0.1 kU/L
Penicillium Chrysogen IgE: <0.1 kU/L
Pigweed, Rough IgE: <0.1 kU/L
Ragweed, Short IgE: 0.11 kU/L — AB
Sheep Sorrel IgE Qn: <0.1 kU/L
Timothy Grass IgE: <0.1 kU/L
White Mulberry IgE: <0.1 kU/L

## 2024-08-04 LAB — HYMENOPTERA VENOM ALLERGY II
Bumblebee: <0.1 kU/L
Hornet, White Face, IgE: 2.77 kU/L — AB
Hornet, Yellow, IgE: 1.15 kU/L — AB
I001-IgE Honeybee: <0.1 kU/L
I003-IgE Yellow Jacket: 1.76 kU/L — AB
I004-IgE Paper Wasp: 1.09 kU/L — AB
I208-IgE Api m 1: <0.1 kU/L
I209-IgE Ves v 5: <0.1 kU/L
I210-IgE Pol d 5: <0.1 kU/L
I211-IgE Ves v 1: 0.12 kU/L — AB
I214-IgE Api m 2: <0.1 kU/L
I215-IgE Api m 3: <0.1 kU/L
I216-IgE Api m 5: <0.1 kU/L
I217-IgE Api m 10: <0.1 kU/L
Tryptase: 4.8 ug/L (ref 2.2–13.2)

## 2024-08-04 LAB — ALPHA-GAL PANEL
Allergen Lamb IgE: <0.1 kU/L
Beef IgE: <0.1 kU/L
IgE (Immunoglobulin E), Serum: 262 [IU]/mL (ref 6–495)
O215-IgE Alpha-Gal: <0.1 kU/L
Pork IgE: <0.1 kU/L

## 2024-08-04 LAB — C3 AND C4
Complement C3, Serum: 105 mg/dL (ref 82–167)
Complement C4, Serum: 22 mg/dL (ref 12–38)

## 2024-08-04 LAB — ALLERGEN COMPONENT COMMENTS

## 2024-08-04 LAB — ALLERGEN FIRE ANT: I070-IgE Fire Ant (Invicta): 1.55 kU/L — AB

## 2024-08-04 LAB — ANTINUCLEAR ANTIBODIES, IFA: ANA Titer 1: NEGATIVE

## 2024-08-04 LAB — O214-IGE CCD DETERMINANTS: O214-IgE CCD Determinants: <0.1 kU/L

## 2024-08-08 ENCOUNTER — Ambulatory Visit: Payer: Self-pay | Admitting: Allergy

## 2024-08-22 NOTE — Progress Notes (Deleted)
 Follow Up Note  RE: Edwin Maldonado MRN: [id number] DOB: 1996/09/03 Date of Office Visit: 08/23/2024  Referring provider: Celestia Harder, NP Primary care provider: Celestia Harder, NP  Chief Complaint: No chief complaint on file.  History of Present Illness: I had the pleasure of seeing Edwin Maldonado for a follow up visit at the [facility] on 08/23/2024. He is a 28 y.o. male, who is being followed for allergic reactions, urticaria and allergic rhinitis. His previous allergy  office visit was on 07/26/2024 with Dr. Luke. Today is a regular follow up visit.  Discussed the use of AI scribe software for clinical note transcription with the patient, who gave verbal consent to proceed.  History of Present Illness            2025 labs: Environmental panel positive to cockroach and borderline to dust mites. See below for environmental control measures.    Blood count, kidney function, liver function, electrolytes, thyroid, autoimmune screener, inflammation markers, chronic urticaria index (checks for autoantibodies that trigger mast cells), tryptase (checks for mast cell issues) and alpha gal (checks for red meat allergy ) were all normal which is great.    Okay to reintroduce red meat back into diet - if you have issues then stop and let us  know.   Stinging insect panel was positive to wasp, white face hornet, yellow hornet, yellow jacket and fire  ant. Based on these results - I question if you got bit/stung by something to cause the allergic reaction. We do have allergy  shots for the above allergens but it is a 4-5 year time commitment.   Keep August appointment to discuss further.   Assessment and Plan: Edwin Maldonado is a 28 y.o. male with: Allergic reaction, initial encounter Acute anaphylactic reaction post-ingestion of meal. Symptoms resolved with IM epi, benadryl , steroids. Etiology unclear, potential red meat allergy  considered. Unknown trigger. Avoid mammalian  meat - no red meat for now. For mild symptoms you can take over the counter antihistamines (zyrtec 10mg  to 20mg ) and monitor symptoms closely.  If symptoms worsen or if you have severe symptoms including breathing issues, throat closure, significant swelling, whole body hives, severe diarrhea and vomiting, lightheadedness then use epinephrine  and seek immediate medical care afterwards. Emergency action plan given. Get bloodwork.   Urticaria Daily hives exacerbated by heat, post-shower, and stress. No prior allergy  testing or regular medication use. Start zyrtec (cetirizine) 10mg  OR allegra (fexofenadine) 180mg  once a day and may take twice a day if needed.  If symptoms are not controlled or causes drowsiness let us  know. Avoid the following potential triggers: alcohol, tight clothing, NSAIDs, hot showers and getting overheated. See below for proper skin care.  Get bloodwork.   Other allergic rhinitis Some nasal congestion in the summer. 2 dogs, 1 cat at home. No prior testing. Get bloodwork. Assessment and Plan              No follow-ups on file.  No orders of the defined types were placed in this encounter.  Lab Orders  No laboratory test(s) ordered today    Diagnostics: Spirometry:  Tracings reviewed. His effort: {Blank single:19197::Good reproducible efforts.,It was hard to get consistent efforts and there is a question as to whether this reflects a maximal maneuver.,Poor effort, data can not be interpreted.} FVC: ***L FEV1: ***L, ***% predicted FEV1/FVC ratio: ***% Interpretation: {Blank single:19197::Spirometry consistent with mild obstructive disease,Spirometry consistent with moderate obstructive disease,Spirometry consistent with severe obstructive disease,Spirometry consistent with possible restrictive disease,Spirometry consistent with mixed  obstructive and restrictive disease,Spirometry uninterpretable due to technique,Spirometry consistent with  normal pattern,No overt abnormalities noted given today's efforts}.  Please see scanned spirometry results for details.  Skin Testing: {Blank single:19197::Select foods,Environmental allergy  panel,Environmental allergy  panel and select foods,Food allergy  panel,None,Deferred due to recent antihistamines use}. *** Results discussed with patient/family.   Medication List:  Current Outpatient Medications  Medication Sig Dispense Refill  . ALPRAZolam  (XANAX ) 0.5 MG tablet Take 0.5 mg by mouth at bedtime as needed.    . EPINEPHrine  0.3 mg/0.3 mL IJ SOAJ injection Inject 0.3 mg into the muscle as needed for anaphylaxis. 1 each 2   No current facility-administered medications for this visit.   Allergies: Allergies  Allergen Reactions  . Other Other (See Comments)    Uncoded Allergy . Allergen: POISON OAK  . Poison Oak Extract    I reviewed his past medical history, social history, family history, and environmental history and no significant changes have been reported from his previous visit.  Review of Systems  Constitutional:  Negative for appetite change, chills, fever and unexpected weight change.  HENT:  Positive for congestion. Negative for rhinorrhea.   Eyes:  Negative for itching.  Respiratory:  Negative for cough, chest tightness, shortness of breath and wheezing.   Cardiovascular:  Negative for chest pain.  Gastrointestinal:  Negative for abdominal pain.  Genitourinary:  Negative for difficulty urinating.  Skin:  Positive for rash.       pruritus  Allergic/Immunologic: Positive for environmental allergies.  Neurological:  Negative for headaches.    Objective: There were no vitals taken for this visit. There is no height or weight on file to calculate BMI. Physical Exam Vitals and nursing note reviewed.  Constitutional:      Appearance: Normal appearance. He is well-developed.  HENT:     Head: Normocephalic and atraumatic.     Right Ear: Tympanic membrane  and external ear normal.     Left Ear: Tympanic membrane and external ear normal.     Nose: Nose normal.     Mouth/Throat:     Mouth: Mucous membranes are moist.     Pharynx: Oropharynx is clear.  Eyes:     Conjunctiva/sclera: Conjunctivae normal.  Cardiovascular:     Rate and Rhythm: Normal rate and regular rhythm.     Heart sounds: Normal heart sounds. No murmur heard.    No friction rub. No gallop.  Pulmonary:     Effort: Pulmonary effort is normal.     Breath sounds: Normal breath sounds. No wheezing, rhonchi or rales.  Musculoskeletal:     Cervical back: Neck supple.  Skin:    General: Skin is warm.     Findings: No rash.  Neurological:     Mental Status: He is alert and oriented to person, place, and time.  Psychiatric:        Behavior: Behavior normal.   Previous notes and tests were reviewed. The plan was reviewed with the patient/family, and all questions/concerned were addressed.  It was my pleasure to see Edwin Maldonado today and participate in his care. Please feel free to contact me with any questions or concerns.  Sincerely,  Orlan Cramp, DO Allergy  & Immunology  [facility]  [place] office: [phone] [facility] office: [phone]

## 2024-08-23 ENCOUNTER — Ambulatory Visit: Admitting: Allergy

## 2024-08-23 DIAGNOSIS — J3089 Other allergic rhinitis: Secondary | ICD-10-CM

## 2024-08-23 DIAGNOSIS — J309 Allergic rhinitis, unspecified: Secondary | ICD-10-CM

## 2024-08-23 DIAGNOSIS — L509 Urticaria, unspecified: Secondary | ICD-10-CM

## 2024-08-23 DIAGNOSIS — Z91038 Other insect allergy status: Secondary | ICD-10-CM

## 2024-08-23 DIAGNOSIS — T7840XD Allergy, unspecified, subsequent encounter: Secondary | ICD-10-CM
